# Patient Record
Sex: Female | Born: 1970
Health system: Southern US, Community
[De-identification: ages and names within clinical notes are randomized; demographics above are authoritative.]

## PROBLEM LIST (undated history)

## (undated) DIAGNOSIS — G459 Transient cerebral ischemic attack, unspecified: Secondary | ICD-10-CM

## (undated) DIAGNOSIS — I639 Cerebral infarction, unspecified: Secondary | ICD-10-CM

## (undated) DIAGNOSIS — E7800 Pure hypercholesterolemia, unspecified: Secondary | ICD-10-CM

## (undated) DIAGNOSIS — R569 Unspecified convulsions: Secondary | ICD-10-CM

## (undated) DIAGNOSIS — C801 Malignant (primary) neoplasm, unspecified: Secondary | ICD-10-CM

## (undated) DIAGNOSIS — D649 Anemia, unspecified: Secondary | ICD-10-CM

## (undated) DIAGNOSIS — I10 Essential (primary) hypertension: Secondary | ICD-10-CM

## (undated) DIAGNOSIS — E119 Type 2 diabetes mellitus without complications: Secondary | ICD-10-CM

## (undated) HISTORY — DX: Cerebral infarction, unspecified: I63.9

## (undated) HISTORY — PX: COLONOSCOPY: SHX174

## (undated) HISTORY — PX: BREAST BIOPSY: SHX20

## (undated) HISTORY — PX: NO PAST SURGERIES: SHX2092

---

## 1998-04-14 ENCOUNTER — Emergency Department (HOSPITAL_COMMUNITY): Admission: EM | Admit: 1998-04-14 | Discharge: 1998-04-14 | Payer: Self-pay | Admitting: Emergency Medicine

## 2017-01-14 ENCOUNTER — Inpatient Hospital Stay (HOSPITAL_COMMUNITY)
Admission: EM | Admit: 2017-01-14 | Discharge: 2017-01-16 | DRG: 100 | Disposition: A | Discharge status: Home / Self Care | Payer: 59 | Attending: Family Medicine | Admitting: Family Medicine

## 2017-01-14 ENCOUNTER — Emergency Department (HOSPITAL_COMMUNITY): Payer: 59

## 2017-01-14 ENCOUNTER — Encounter (HOSPITAL_COMMUNITY): Payer: Self-pay

## 2017-01-14 DIAGNOSIS — I6783 Posterior reversible encephalopathy syndrome: Secondary | ICD-10-CM

## 2017-01-14 DIAGNOSIS — R569 Unspecified convulsions: Secondary | ICD-10-CM

## 2017-01-14 DIAGNOSIS — I6340 Cerebral infarction due to embolism of unspecified cerebral artery: Secondary | ICD-10-CM | POA: Diagnosis present

## 2017-01-14 DIAGNOSIS — G4089 Other seizures: Principal | ICD-10-CM | POA: Diagnosis present

## 2017-01-14 DIAGNOSIS — E119 Type 2 diabetes mellitus without complications: Secondary | ICD-10-CM

## 2017-01-14 DIAGNOSIS — I5189 Other ill-defined heart diseases: Secondary | ICD-10-CM

## 2017-01-14 DIAGNOSIS — I639 Cerebral infarction, unspecified: Secondary | ICD-10-CM

## 2017-01-14 DIAGNOSIS — I160 Hypertensive urgency: Secondary | ICD-10-CM | POA: Diagnosis present

## 2017-01-14 DIAGNOSIS — I161 Hypertensive emergency: Secondary | ICD-10-CM | POA: Diagnosis present

## 2017-01-14 DIAGNOSIS — R202 Paresthesia of skin: Secondary | ICD-10-CM | POA: Diagnosis present

## 2017-01-14 DIAGNOSIS — E669 Obesity, unspecified: Secondary | ICD-10-CM | POA: Diagnosis present

## 2017-01-14 DIAGNOSIS — E785 Hyperlipidemia, unspecified: Secondary | ICD-10-CM | POA: Diagnosis present

## 2017-01-14 DIAGNOSIS — Z6838 Body mass index (BMI) 38.0-38.9, adult: Secondary | ICD-10-CM

## 2017-01-14 DIAGNOSIS — I5030 Unspecified diastolic (congestive) heart failure: Secondary | ICD-10-CM | POA: Diagnosis present

## 2017-01-14 DIAGNOSIS — G4733 Obstructive sleep apnea (adult) (pediatric): Secondary | ICD-10-CM | POA: Diagnosis present

## 2017-01-14 DIAGNOSIS — I110 Hypertensive heart disease with heart failure: Secondary | ICD-10-CM | POA: Diagnosis present

## 2017-01-14 DIAGNOSIS — R29700 NIHSS score 0: Secondary | ICD-10-CM | POA: Diagnosis present

## 2017-01-14 DIAGNOSIS — D649 Anemia, unspecified: Secondary | ICD-10-CM | POA: Diagnosis present

## 2017-01-14 DIAGNOSIS — I10 Essential (primary) hypertension: Secondary | ICD-10-CM

## 2017-01-14 DIAGNOSIS — E1165 Type 2 diabetes mellitus with hyperglycemia: Secondary | ICD-10-CM | POA: Diagnosis present

## 2017-01-14 LAB — I-STAT CHEM 8, ED
BUN: 10 mg/dL (ref 6–20)
CALCIUM ION: 1.2 mmol/L (ref 1.15–1.40)
CHLORIDE: 103 mmol/L (ref 101–111)
CREATININE: 0.7 mg/dL (ref 0.44–1.00)
GLUCOSE: 416 mg/dL — AB (ref 65–99)
HCT: 33 % — ABNORMAL LOW (ref 36.0–46.0)
Hemoglobin: 11.2 g/dL — ABNORMAL LOW (ref 12.0–15.0)
Potassium: 4.2 mmol/L (ref 3.5–5.1)
Sodium: 135 mmol/L (ref 135–145)
TCO2: 22 mmol/L (ref 22–32)

## 2017-01-14 LAB — DIFFERENTIAL
BASOS PCT: 0 %
Basophils Absolute: 0 10*3/uL (ref 0.0–0.1)
EOS PCT: 1 %
Eosinophils Absolute: 0.1 10*3/uL (ref 0.0–0.7)
LYMPHS ABS: 2.4 10*3/uL (ref 0.7–4.0)
LYMPHS PCT: 26 %
Monocytes Absolute: 0.6 10*3/uL (ref 0.1–1.0)
Monocytes Relative: 6 %
Neutro Abs: 6.1 10*3/uL (ref 1.7–7.7)
Neutrophils Relative %: 67 %

## 2017-01-14 LAB — COMPREHENSIVE METABOLIC PANEL
ALBUMIN: 3.9 g/dL (ref 3.5–5.0)
ALK PHOS: 84 U/L (ref 38–126)
ALT: 12 U/L — AB (ref 14–54)
ANION GAP: 6 (ref 5–15)
AST: 17 U/L (ref 15–41)
BILIRUBIN TOTAL: 0.4 mg/dL (ref 0.3–1.2)
BUN: 10 mg/dL (ref 6–20)
CALCIUM: 9.8 mg/dL (ref 8.9–10.3)
CO2: 20 mmol/L — ABNORMAL LOW (ref 22–32)
CREATININE: 0.88 mg/dL (ref 0.44–1.00)
Chloride: 102 mmol/L (ref 101–111)
GFR calc Af Amer: >60 mL/min (ref >60)
GFR calc non Af Amer: >60 mL/min (ref >60)
GLUCOSE: 423 mg/dL — AB (ref 65–99)
Potassium: 4.2 mmol/L (ref 3.5–5.1)
Sodium: 128 mmol/L — ABNORMAL LOW (ref 135–145)
TOTAL PROTEIN: 7.2 g/dL (ref 6.5–8.1)

## 2017-01-14 LAB — CBC
HEMATOCRIT: 30.6 % — AB (ref 36.0–46.0)
Hemoglobin: 8.7 g/dL — ABNORMAL LOW (ref 12.0–15.0)
MCH: 19.9 pg — AB (ref 26.0–34.0)
MCHC: 28.4 g/dL — ABNORMAL LOW (ref 30.0–36.0)
MCV: 69.9 fL — ABNORMAL LOW (ref 78.0–100.0)
PLATELETS: 519 10*3/uL — AB (ref 150–400)
RBC: 4.38 MIL/uL (ref 3.87–5.11)
RDW: 16 % — AB (ref 11.5–15.5)
WBC: 9.2 10*3/uL (ref 4.0–10.5)

## 2017-01-14 LAB — PROTIME-INR
INR: 1.03
PROTHROMBIN TIME: 13.4 s (ref 11.4–15.2)

## 2017-01-14 LAB — APTT: aPTT: 29 seconds (ref 24–36)

## 2017-01-14 LAB — I-STAT TROPONIN, ED: Troponin i, poc: 0 ng/mL (ref 0.00–0.08)

## 2017-01-14 LAB — CBG MONITORING, ED: GLUCOSE-CAPILLARY: 394 mg/dL — AB (ref 65–99)

## 2017-01-14 MED ORDER — LABETALOL HCL 5 MG/ML IV SOLN
5.0000 mg | Freq: Once | INTRAVENOUS | Status: AC
Start: 2017-01-15 — End: 2017-01-14
  Administered 2017-01-14: 5 mg via INTRAVENOUS
  Filled 2017-01-14: qty 4

## 2017-01-14 MED ORDER — SODIUM CHLORIDE 0.9 % IV SOLN
1000.0000 mg | Freq: Once | INTRAVENOUS | Status: AC
  Administered 2017-01-14: 1000 mg via INTRAVENOUS
  Filled 2017-01-14: qty 10

## 2017-01-14 NOTE — ED Provider Notes (Signed)
[facility] DEPT Provider Note   CSN: [id number] Arrival date & time: 01/14/17  2309     History   Chief Complaint Chief Complaint  Patient presents with  . Code Stroke    HPI Kelly Davies is a 46 y.o. female.  The history is provided by the patient and medical records.    46 y.o. F with hx of seasonal allergies, presenting to the ED via EMS as a code stroke.  Patient reports she woke up from a nap around 2230 and noticed that her left arm was twitching and felt "odd".  States she also felt a little foggy headed.  EMS was called and felt that she had some left sided weakness so code stroke was activated.  On arrival to ED she is AAOx3.  No focal deficits noted here.  No pain, SOB, or recent illness.  No history of seizures or HTN.  No family hx of seizure.  Reports brother recently had MI, he is 75.  No other known cardiac hx.  She is not a smoker.  Patient's BP has improved here but remains hypertensive.  No PCP.  History reviewed. No pertinent past medical history.  There are no active problems to display for this patient.   History reviewed. No pertinent surgical history.  OB History    No data available       Home Medications    Prior to Admission medications   Not on File    Family History No family history on file.  Social History Social History  Substance Use Topics  . Smoking status: Never Smoker  . Smokeless tobacco: Never Used  . Alcohol use No     Allergies   Patient has no allergy information on record.   Review of Systems Review of Systems  Neurological: Positive for weakness.  All other systems reviewed and are negative.    Physical Exam Updated Vital Signs Wt 99.7 kg (219 lb 12.8 oz)   Physical Exam  Constitutional: She is oriented to person, place, and time. She appears well-developed and well-nourished.  HENT:  Head: Normocephalic and atraumatic.  Mouth/Throat: Oropharynx is clear and moist.  Eyes: Pupils are equal,  round, and reactive to light. Conjunctivae and EOM are normal.  Neck: Normal range of motion.  Cardiovascular: Regular rhythm and normal heart sounds.  Tachycardia present.   Pulmonary/Chest: Effort normal and breath sounds normal. No respiratory distress. She has no wheezes.  Abdominal: Soft. Bowel sounds are normal. There is no tenderness. There is no rebound.  Musculoskeletal: Normal range of motion.  Neurological: She is alert and oriented to person, place, and time.  AAOx3, answering questions and following commands appropriately; equal strength UE and LE bilaterally; CN grossly intact; moves all extremities appropriately without ataxia; no focal neuro deficits or facial asymmetry appreciated  Skin: Skin is warm and dry.  Psychiatric: She has a normal mood and affect.  Nursing note and vitals reviewed.    ED Treatments / Results  Labs (all labs ordered are listed, but only abnormal results are displayed) Labs Reviewed  CBC - Abnormal; Notable for the following:       Result Value   Hemoglobin 8.7 (*)    HCT 30.6 (*)    MCV 69.9 (*)    MCH 19.9 (*)    MCHC 28.4 (*)    RDW 16.0 (*)    Platelets 519 (*)    All other components within normal limits  COMPREHENSIVE METABOLIC PANEL - Abnormal; Notable for  the following:    Sodium 128 (*)    CO2 20 (*)    Glucose, Bld 423 (*)    ALT 12 (*)    All other components within normal limits  CBG MONITORING, ED - Abnormal; Notable for the following:    Glucose-Capillary 394 (*)    All other components within normal limits  I-STAT CHEM 8, ED - Abnormal; Notable for the following:    Glucose, Bld 416 (*)    Hemoglobin 11.2 (*)    HCT 33.0 (*)    All other components within normal limits  PROTIME-INR  APTT  DIFFERENTIAL  HIV ANTIBODY (ROUTINE TESTING)  HEMOGLOBIN A1C  CBC  BASIC METABOLIC PANEL  I-STAT TROPONIN, ED    EKG  EKG Interpretation None       Radiology Ct Head Code Stroke Wo Contrast  Result Date:  01/14/2017 CLINICAL DATA:  Code stroke.  LEFT-sided weakness and numbness. EXAM: CT HEAD WITHOUT CONTRAST TECHNIQUE: Contiguous axial images were obtained from the base of the skull through the vertex without intravenous contrast. COMPARISON:  None. FINDINGS: BRAIN: No intraparenchymal hemorrhage, mass effect nor midline shift. The ventricles and sulci are normal. No acute large vascular territory infarcts. No abnormal extra-axial fluid collections. Beam hardening artifact through ventral pons. Basal cisterns are patent. VASCULAR: Unremarkable. SKULL/SOFT TISSUES: No skull fracture. No significant soft tissue swelling. ORBITS/SINUSES: The included ocular globes and orbital contents are normal.The mastoid aircells and included paranasal sinuses are well-aerated. OTHER: None. ASPECTS [facility] Stroke Program Early CT Score) - Ganglionic level infarction (caudate, lentiform nuclei, internal capsule, insula, M1-M3 cortex): 7 - Supraganglionic infarction (M4-M6 cortex): 3 Total score (0-10 with 10 being normal): 10 IMPRESSION: 1. Normal noncontrast CT HEAD. 2. ASPECTS is 10. 3. Critical Value/emergent results were called by telephone at the time of interpretation on 01/14/2017 at 11:24 pm to Dr. Leonel Ramsay, Neurology, who verbally acknowledged these results. Electronically Signed   By: Elon Alas M.D.   On: 01/14/2017 23:24    Procedures Procedures (including critical care time)  CRITICAL CARE Performed by: Larene Pickett   Total critical care time: 40 minutes  Critical care time was exclusive of separately billable procedures and treating other patients.  Critical care was necessary to treat or prevent imminent or life-threatening deterioration.  Critical care was time spent personally by me on the following activities: development of treatment plan with patient and/or surrogate as well as nursing, discussions with consultants, evaluation of patient's response to treatment, examination of patient,  obtaining history from patient or surrogate, ordering and performing treatments and interventions, ordering and review of laboratory studies, ordering and review of radiographic studies, pulse oximetry and re-evaluation of patient's condition.   Medications Ordered in ED Medications  levETIRAcetam (KEPPRA) 500 mg in sodium chloride 0.9 % 100 mL IVPB (not administered)  amLODipine (NORVASC) tablet 5 mg (not administered)  acetaminophen (TYLENOL) tablet 650 mg (not administered)    Or  acetaminophen (TYLENOL) suppository 650 mg (not administered)  ondansetron (ZOFRAN) tablet 4 mg (not administered)    Or  ondansetron (ZOFRAN) injection 4 mg (not administered)  enoxaparin (LOVENOX) injection 40 mg (not administered)  insulin aspart (novoLOG) injection 0-15 Units (not administered)  insulin glargine (LANTUS) injection 10 Units (not administered)  levETIRAcetam (KEPPRA) 1,000 mg in sodium chloride 0.9 % 100 mL IVPB (0 mg Intravenous Stopped 01/15/17 0017)  labetalol (NORMODYNE,TRANDATE) injection 5 mg (5 mg Intravenous Given 01/14/17 2349)     Initial Impression / Assessment and Plan / ED  Course  I have reviewed the triage vital signs and the nursing notes.  Pertinent labs & imaging results that were available during my care of the patient were reviewed by me and considered in my medical decision making (see chart for details).  46 y.o. F presenting to the ED as a code stroke.  LKW 2230 after she woke from a nap.  EMS felt left sided weakness so code stroke activated.  No focal deficits noted here.  States she feels better currently. Patient has no known past medical history, however was very hypertensive on arrival to 553'Z systolic.  Glucose also elevated into the 400's.  Initial head CT negative.  Neurology, Dr. Leonel Ramsay has evaluated-- feels this is likely PRES.  Will treat at hypertensive emergency, start keppra- load 1g now, then 500mg  BID.  They will folllow along.  BP's trending down  after dose of labetolol here.  Spoke with Dr. Alcario Drought-- he will admit for ongoing care.  Final Clinical Impressions(s) / ED Diagnoses   Final diagnoses:  PRES (posterior reversible encephalopathy syndrome)  Seizure ([place])  Hypertension, unspecified type    New Prescriptions New Prescriptions   No medications on file     Larene Pickett, PA-C 01/15/17 0211    Merryl Hacker, MD 01/16/17 1128

## 2017-01-14 NOTE — ED Triage Notes (Signed)
Pt from home with ems, code stroke LSN 2230 today. Pt woke up from a nap with her left arm twitching, pt also felt "foggy". Upon EMS arrival pt had decreased grip strength in left hand, tingling has improved. Pt hypertensive 240/120 intially, pt now 188/98. Pt a.o, nad, 20GLhand

## 2017-01-14 NOTE — Progress Notes (Signed)
Kelly Davies presents to ED via [facility] EMS with acute onset of Left arm twitching and feeling "foggy." Per EMS pt grip strength was weak to Left hand, Left leg was "wobbling" and had a approx 10 sec Right side gaze. Pt was hypertensive with BP 240/120 on EMS arrival. Only medical history is seasonal allergies. Code stroke canceled at 2321. Pt to be admitted to [facility] for further evaluation. LKW 2225, CBG 394, NIH 0

## 2017-01-15 ENCOUNTER — Observation Stay (HOSPITAL_COMMUNITY): Payer: 59

## 2017-01-15 ENCOUNTER — Inpatient Hospital Stay (HOSPITAL_COMMUNITY): Payer: 59

## 2017-01-15 DIAGNOSIS — I6783 Posterior reversible encephalopathy syndrome: Secondary | ICD-10-CM | POA: Diagnosis not present

## 2017-01-15 DIAGNOSIS — I10 Essential (primary) hypertension: Secondary | ICD-10-CM | POA: Diagnosis not present

## 2017-01-15 DIAGNOSIS — E119 Type 2 diabetes mellitus without complications: Secondary | ICD-10-CM

## 2017-01-15 DIAGNOSIS — I639 Cerebral infarction, unspecified: Secondary | ICD-10-CM | POA: Diagnosis not present

## 2017-01-15 DIAGNOSIS — I5189 Other ill-defined heart diseases: Secondary | ICD-10-CM | POA: Diagnosis not present

## 2017-01-15 DIAGNOSIS — R569 Unspecified convulsions: Secondary | ICD-10-CM | POA: Diagnosis present

## 2017-01-15 DIAGNOSIS — G4733 Obstructive sleep apnea (adult) (pediatric): Secondary | ICD-10-CM | POA: Diagnosis present

## 2017-01-15 DIAGNOSIS — G4089 Other seizures: Secondary | ICD-10-CM | POA: Diagnosis present

## 2017-01-15 DIAGNOSIS — E785 Hyperlipidemia, unspecified: Secondary | ICD-10-CM | POA: Diagnosis present

## 2017-01-15 DIAGNOSIS — I5030 Unspecified diastolic (congestive) heart failure: Secondary | ICD-10-CM | POA: Diagnosis present

## 2017-01-15 DIAGNOSIS — I160 Hypertensive urgency: Secondary | ICD-10-CM | POA: Diagnosis not present

## 2017-01-15 DIAGNOSIS — D649 Anemia, unspecified: Secondary | ICD-10-CM | POA: Diagnosis present

## 2017-01-15 DIAGNOSIS — I161 Hypertensive emergency: Secondary | ICD-10-CM | POA: Diagnosis present

## 2017-01-15 DIAGNOSIS — R202 Paresthesia of skin: Secondary | ICD-10-CM | POA: Diagnosis present

## 2017-01-15 DIAGNOSIS — R29700 NIHSS score 0: Secondary | ICD-10-CM | POA: Diagnosis present

## 2017-01-15 DIAGNOSIS — I6340 Cerebral infarction due to embolism of unspecified cerebral artery: Secondary | ICD-10-CM | POA: Diagnosis present

## 2017-01-15 DIAGNOSIS — Z6838 Body mass index (BMI) 38.0-38.9, adult: Secondary | ICD-10-CM | POA: Diagnosis not present

## 2017-01-15 DIAGNOSIS — E1165 Type 2 diabetes mellitus with hyperglycemia: Secondary | ICD-10-CM | POA: Diagnosis present

## 2017-01-15 DIAGNOSIS — E669 Obesity, unspecified: Secondary | ICD-10-CM | POA: Diagnosis present

## 2017-01-15 DIAGNOSIS — I110 Hypertensive heart disease with heart failure: Secondary | ICD-10-CM | POA: Diagnosis present

## 2017-01-15 LAB — BASIC METABOLIC PANEL
ANION GAP: 9 (ref 5–15)
BUN: 10 mg/dL (ref 6–20)
CO2: 22 mmol/L (ref 22–32)
Calcium: 10.1 mg/dL (ref 8.9–10.3)
Chloride: 103 mmol/L (ref 101–111)
Creatinine, Ser: 0.73 mg/dL (ref 0.44–1.00)
GLUCOSE: 336 mg/dL — AB (ref 65–99)
POTASSIUM: 4.1 mmol/L (ref 3.5–5.1)
Sodium: 134 mmol/L — ABNORMAL LOW (ref 135–145)

## 2017-01-15 LAB — GLUCOSE, CAPILLARY
GLUCOSE-CAPILLARY: 225 mg/dL — AB (ref 65–99)
GLUCOSE-CAPILLARY: 224 mg/dL — AB (ref 65–99)
Glucose-Capillary: 341 mg/dL — ABNORMAL HIGH (ref 65–99)
Glucose-Capillary: 220 mg/dL — ABNORMAL HIGH (ref 65–99)

## 2017-01-15 LAB — CBC
HEMATOCRIT: 28 % — AB (ref 36.0–46.0)
Hemoglobin: 8.4 g/dL — ABNORMAL LOW (ref 12.0–15.0)
MCH: 20.8 pg — ABNORMAL LOW (ref 26.0–34.0)
MCHC: 30 g/dL (ref 30.0–36.0)
MCV: 69.5 fL — ABNORMAL LOW (ref 78.0–100.0)
PLATELETS: 490 10*3/uL — AB (ref 150–400)
RBC: 4.03 MIL/uL (ref 3.87–5.11)
RDW: 16.5 % — ABNORMAL HIGH (ref 11.5–15.5)
WBC: 9.5 10*3/uL (ref 4.0–10.5)

## 2017-01-15 LAB — HIV ANTIBODY (ROUTINE TESTING W REFLEX): HIV SCREEN 4TH GENERATION: NONREACTIVE

## 2017-01-15 LAB — LIPID PANEL
CHOLESTEROL: 176 mg/dL (ref 0–200)
HDL: 38 mg/dL — AB (ref >40)
LDL CALC: 118 mg/dL — AB (ref 0–99)
TRIGLYCERIDES: 102 mg/dL (ref <150)
Total CHOL/HDL Ratio: 4.6 RATIO
VLDL: 20 mg/dL (ref 0–40)

## 2017-01-15 LAB — HEMOGLOBIN A1C
Hgb A1c MFr Bld: 10.5 % — ABNORMAL HIGH (ref 4.8–5.6)
MEAN PLASMA GLUCOSE: 254.65 mg/dL

## 2017-01-15 MED ORDER — STROKE: EARLY STAGES OF RECOVERY BOOK
Freq: Once | Status: AC
  Administered 2017-01-15: 19:00:00
  Filled 2017-01-15: qty 1

## 2017-01-15 MED ORDER — INSULIN GLARGINE 100 UNIT/ML ~~LOC~~ SOLN
10.0000 [IU] | Freq: Every day | SUBCUTANEOUS | Status: DC
  Administered 2017-01-15: 10 [IU] via SUBCUTANEOUS
  Filled 2017-01-15 (×2): qty 0.1

## 2017-01-15 MED ORDER — ENOXAPARIN SODIUM 40 MG/0.4ML ~~LOC~~ SOLN
40.0000 mg | SUBCUTANEOUS | Status: DC
  Administered 2017-01-15 – 2017-01-16 (×2): 40 mg via SUBCUTANEOUS
  Filled 2017-01-15 (×2): qty 0.4

## 2017-01-15 MED ORDER — INSULIN ASPART 100 UNIT/ML ~~LOC~~ SOLN
0.0000 [IU] | Freq: Three times a day (TID) | SUBCUTANEOUS | Status: DC
Start: 2017-01-15 — End: 2017-01-16
  Administered 2017-01-15 (×2): 5 [IU] via SUBCUTANEOUS
  Administered 2017-01-15: 11 [IU] via SUBCUTANEOUS
  Administered 2017-01-16: 3 [IU] via SUBCUTANEOUS
  Administered 2017-01-16: 8 [IU] via SUBCUTANEOUS
  Administered 2017-01-16: 5 [IU] via SUBCUTANEOUS

## 2017-01-15 MED ORDER — AMLODIPINE BESYLATE 5 MG PO TABS
5.0000 mg | ORAL_TABLET | Freq: Every day | ORAL | Status: DC
  Administered 2017-01-15 – 2017-01-16 (×2): 5 mg via ORAL
  Filled 2017-01-15 (×2): qty 1

## 2017-01-15 MED ORDER — ONDANSETRON HCL 4 MG/2ML IJ SOLN: 4.0000 mg | Freq: Four times a day (QID) | INTRAMUSCULAR | Status: DC | PRN

## 2017-01-15 MED ORDER — SODIUM CHLORIDE 0.9 % IV SOLN
500.0000 mg | Freq: Two times a day (BID) | INTRAVENOUS | Status: DC
  Administered 2017-01-16: 500 mg via INTRAVENOUS
  Filled 2017-01-15 (×2): qty 5

## 2017-01-15 MED ORDER — AMLODIPINE BESYLATE 5 MG PO TABS: 5.0000 mg | ORAL_TABLET | Freq: Every day | ORAL | Status: DC

## 2017-01-15 MED ORDER — ASPIRIN 325 MG PO TABS
325.0000 mg | ORAL_TABLET | Freq: Every day | ORAL | Status: DC
  Administered 2017-01-15 – 2017-01-16 (×2): 325 mg via ORAL
  Filled 2017-01-15 (×2): qty 1

## 2017-01-15 MED ORDER — ACETAMINOPHEN 325 MG PO TABS
650.0000 mg | ORAL_TABLET | Freq: Four times a day (QID) | ORAL | Status: DC | PRN
  Administered 2017-01-15: 650 mg via ORAL
  Filled 2017-01-15: qty 2

## 2017-01-15 MED ORDER — ONDANSETRON HCL 4 MG PO TABS: 4.0000 mg | ORAL_TABLET | Freq: Four times a day (QID) | ORAL | Status: DC | PRN

## 2017-01-15 MED ORDER — ACETAMINOPHEN 650 MG RE SUPP: 650.0000 mg | Freq: Four times a day (QID) | RECTAL | Status: DC | PRN

## 2017-01-15 MED ORDER — ATORVASTATIN CALCIUM 40 MG PO TABS
40.0000 mg | ORAL_TABLET | Freq: Every day | ORAL | Status: DC
  Administered 2017-01-15 – 2017-01-16 (×2): 40 mg via ORAL
  Filled 2017-01-15 (×2): qty 1

## 2017-01-15 MED ORDER — IOPAMIDOL (ISOVUE-370) INJECTION 76%
INTRAVENOUS | Status: AC
  Administered 2017-01-15: 50 mL
  Filled 2017-01-15: qty 50

## 2017-01-15 NOTE — Progress Notes (Signed)
Subjective: Fells the left UE is heavy  Exam: Vitals:   01/15/17 0505 01/15/17 1014  BP: (!) 160/81 (!) 153/87  Pulse: (!) 102   Resp: 14   Temp: 98.3 F (36.8 C)   SpO2: 100%     HEENT-  Normocephalic, no lesions, without obvious abnormality.  Normal external eye and conjunctiva.  Normal TM's bilaterally.  Normal auditory canals and external ears. Normal external nose, mucus membranes and septum.  Normal pharynx.    Neuro:  CN: Pupils are equal and round. They are symmetrically reactive from 3-->2 mm. EOMI without nystagmus. Facial sensation is intact to light touch. Face is symmetric at rest with normal strength and mobility. Hearing is intact to conversational voice. Palate elevates symmetrically and uvula is midline. Voice is normal in tone, pitch and quality. Bilateral SCM and trapezii are 5/5. Tongue is midline with normal bulk and mobility.  Motor: Normal bulk, tone, and strength. 5/5 throughout RU/RL extremitieswith 4+/5 LUE and 5/5 LLE. No drift.  Sensation: Intact to light touch.  DTRs: 2+, symmetric  Toes downgoing bilaterally. No pathologic reflexes.  Coordination: Finger-to-nose and heel-to-shin are without dysmetria     Pertinent Labs/Diagnostics: MRI brain--Multiple tiny likely acute RIGHT frontoparietal (ACA and MCA [place]). A1c 10.5--goal is less than 5.7    Impression: Multiple tiny likely acute RIGHT frontoparietal (ACA and MCA territory).   Recommendations: 1) CTA head and neck 2) ASA daily 3) goal A1c 5.7 4) continue Blood pressure control 5) May D/C Keppra on discharge 6) EEG pending   Stroke team to follow in AM  Etta Quill PA-C [facility] [phone] 01/15/2017, 10:52 AM

## 2017-01-15 NOTE — Progress Notes (Signed)
EEG completed; results pending.    

## 2017-01-15 NOTE — Progress Notes (Signed)
Inpatient Diabetes Program Recommendations  AACE/ADA: New Consensus Statement on Inpatient Glycemic Control (2015)  Target Ranges:  Prepandial:   less than 140 mg/dL      Peak postprandial:   less than 180 mg/dL (1-2 hours)      Critically ill patients:  140 - 180 mg/dL   Lab Results  Component Value Date   GLUCAP 225 (H) 01/15/2017   HGBA1C 10.5 (H) 01/15/2017    Review of Glycemic Control  Diabetes history: New-onset Outpatient Diabetes medications: None Current orders for Inpatient glycemic control: Lantus 10 units QHS, Novolog 0-15 units tidwc  HgbA1C - 10.5%  Inpatient Diabetes Program Recommendations:    Add HS correction Will order Living Well book and diabetes videos on pt ed channel. OP Diabetes Education after discharge.  Will speak with pt in am regarding new diagnosis of DM.  Thank you. Lorenda Peck, RD, LDN, CDE Inpatient Diabetes Coordinator [phone]

## 2017-01-15 NOTE — ED Notes (Signed)
Patient transported to MRI 

## 2017-01-15 NOTE — H&P (Signed)
History and Physical    Kelly Davies DOB: June 08, 1970 DOA: 01/14/2017  PCP: Patient, No Pcp Per  Patient coming from: Home  I have personally briefly reviewed patient's old medical records in [place]  Chief Complaint: Seizure  HPI: Kelly Davies is a 46 y.o. female with medical history significant of doesn't see a doctor.  Patient presents to the ED after waking up earlier this evening with onset of tremor to LUE and LLE.  Had weakness after event and was "foggy".  20-30 min L sided weakness after event.  Event lasted 1 min.   ED Course: CT head neg.  Patient with initial BP 240/120 per EMS, treated as PRES in ED and patient given 5mg  labetalol.  BP improved to 093G systolic at this time.  Also she has undiagnosed DM with BGL 423.   Review of Systems: As per HPI otherwise 10 point review of systems negative.   History reviewed. No pertinent past medical history.  History reviewed. No pertinent surgical history.   reports that she has never smoked. She has never used smokeless tobacco. She reports that she does not drink alcohol or use drugs.  No Known Allergies  No family history on file. No family history of seizures  Prior to Admission medications   Not on File    Physical Exam: Vitals:   01/15/17 0046 01/15/17 0100 01/15/17 0115 01/15/17 0145  BP:  (!) 162/87 (!) 154/84 (!) 148/79  Pulse:  (!) 105  (!) 104  Resp:  18 (!) 22 18  Temp: 98.8 F (37.1 C)     TempSrc:      SpO2:  99%  99%  Weight:        Constitutional: NAD, calm, comfortable Eyes: PERRL, lids and conjunctivae normal ENMT: Mucous membranes are moist. Posterior pharynx clear of any exudate or lesions.Normal dentition.  Neck: normal, supple, no masses, no thyromegaly Respiratory: clear to auscultation bilaterally, no wheezing, no crackles. Normal respiratory effort. No accessory muscle use.  Cardiovascular: Regular rate and rhythm, no murmurs / rubs / gallops. No  extremity edema. 2+ pedal pulses. No carotid bruits.  Abdomen: no tenderness, no masses palpated. No hepatosplenomegaly. Bowel sounds positive.  Musculoskeletal: no clubbing / cyanosis. No joint deformity upper and lower extremities. Good ROM, no contractures. Normal muscle tone.  Skin: no rashes, lesions, ulcers. No induration Neurologic: CN 2-12 grossly intact. Sensation intact, DTR normal. Strength 5/5 in all 4.  Psychiatric: Normal judgment and insight. Alert and oriented x 3. Normal mood.    Labs on Admission: I have personally reviewed following labs and imaging studies  CBC:  Recent Labs Lab 01/14/17 2310 01/14/17 2319  WBC 9.2  --   NEUTROABS 6.1  --   HGB 8.7* 11.2*  HCT 30.6* 33.0*  MCV 69.9*  --   PLT 519*  --    Basic Metabolic Panel:  Recent Labs Lab 01/14/17 2310 01/14/17 2319  NA 128* 135  K 4.2 4.2  CL 102 103  CO2 20*  --   GLUCOSE 423* 416*  BUN 10 10  CREATININE 0.88 0.70  CALCIUM 9.8  --    GFR: CrCl cannot be calculated (Unknown ideal weight.). Liver Function Tests:  Recent Labs Lab 01/14/17 2310  AST 17  ALT 12*  ALKPHOS 84  BILITOT 0.4  PROT 7.2  ALBUMIN 3.9   No results for input(s): LIPASE, AMYLASE in the last 168 hours. No results for input(s): AMMONIA in the last 168 hours.  Coagulation Profile:  Recent Labs Lab 01/14/17 2310  INR 1.03   Cardiac Enzymes: No results for input(s): CKTOTAL, CKMB, CKMBINDEX, TROPONINI in the last 168 hours. BNP (last 3 results) No results for input(s): PROBNP in the last 8760 hours. HbA1C: No results for input(s): HGBA1C in the last 72 hours. CBG:  Recent Labs Lab 01/14/17 2312  GLUCAP 394*   Lipid Profile: No results for input(s): CHOL, HDL, LDLCALC, TRIG, CHOLHDL, LDLDIRECT in the last 72 hours. Thyroid Function Tests: No results for input(s): TSH, T4TOTAL, FREET4, T3FREE, THYROIDAB in the last 72 hours. Anemia Panel: No results for input(s): VITAMINB12, FOLATE, FERRITIN, TIBC,  IRON, RETICCTPCT in the last 72 hours. Urine analysis: No results found for: COLORURINE, APPEARANCEUR, LABSPEC, Delta, GLUCOSEU, HGBUR, BILIRUBINUR, KETONESUR, PROTEINUR, UROBILINOGEN, NITRITE, LEUKOCYTESUR  Radiological Exams on Admission: Ct Head Code Stroke Wo Contrast  Result Date: 01/14/2017 CLINICAL DATA:  Code stroke.  LEFT-sided weakness and numbness. EXAM: CT HEAD WITHOUT CONTRAST TECHNIQUE: Contiguous axial images were obtained from the base of the skull through the vertex without intravenous contrast. COMPARISON:  None. FINDINGS: BRAIN: No intraparenchymal hemorrhage, mass effect nor midline shift. The ventricles and sulci are normal. No acute large vascular territory infarcts. No abnormal extra-axial fluid collections. Beam hardening artifact through ventral pons. Basal cisterns are patent. VASCULAR: Unremarkable. SKULL/SOFT TISSUES: No skull fracture. No significant soft tissue swelling. ORBITS/SINUSES: The included ocular globes and orbital contents are normal.The mastoid aircells and included paranasal sinuses are well-aerated. OTHER: None. ASPECTS [facility] Stroke Program Early CT Score) - Ganglionic level infarction (caudate, lentiform nuclei, internal capsule, insula, M1-M3 cortex): 7 - Supraganglionic infarction (M4-M6 cortex): 3 Total score (0-10 with 10 being normal): 10 IMPRESSION: 1. Normal noncontrast CT HEAD. 2. ASPECTS is 10. 3. Critical Value/emergent results were called by telephone at the time of interpretation on 01/14/2017 at 11:24 pm to Dr. Leonel Ramsay, Neurology, who verbally acknowledged these results. Electronically Signed   By: Elon Alas M.D.   On: 01/14/2017 23:24    EKG: Independently reviewed.  Assessment/Plan Principal Problem:   Seizure [facility]) Active Problems:   New onset type 2 diabetes mellitus ([place])   Hypertensive urgency    1. Seizure - likely hypertensive urgency 1. Keppra 1gm load and 500 BID per neurology 2. MRI brain 3. EEG 2. HTN  urgency - 1. Resolved with labetalol 2. Start Norvasc 5mg  PO daily this morning, titrate up to 10mg  if needed 3. New onset DM2 - 1. Will put patient on Lantus 10 units QHS 2. And mod scale SSI AC while inpatient 3. Suspect she ultimately will be a candidate for PO hypoglycemics as outpatient (metformin) 4. DM nurse educator consult 4. ? Anemia - 1. HGB 8.7 on initial CBC then 11.2 on I-stat 2. Will repeat CBC in AM, if anemia confirmed, then needs anemia work up.  DVT prophylaxis: Lovenox Code Status: Full Family Communication: Family at bedside Disposition Plan: Home after admit Consults called: Dr. Leonel Ramsay has seen patient Admission status: Place in obs   Deneen Slager, [place] [facility] Pager [phone]  If 7AM-7PM, please contact day team taking care of patient [email or web address] Password [email or web address]  01/15/2017, 1:53 AM

## 2017-01-15 NOTE — Procedures (Signed)
ELECTROENCEPHALOGRAM REPORT  Date of Study: 01/15/2017  Patient's Name: Kelly Davies MRN: [id number] Date of Birth: June 09, 1970  Referring Provider: Dr. Roland Rack  Clinical History: This is a 46 year old woman with left sided tremor followed by weakness and confusion.  Medications: levETIRAcetam (KEPPRA) 500 mg in sodium chloride 0.9 % 100 mL IVPB  acetaminophen (TYLENOL) tablet 650 mg  amLODipine (NORVASC) tablet 5 mg  aspirin tablet 325 mg  atorvastatin (LIPITOR) tablet 40 mg  enoxaparin (LOVENOX) injection 40 mg  insulin aspart (novoLOG) injection 0-15 Units  insulin glargine (LANTUS) injection 10 Units   Technical Summary: A multichannel digital EEG recording measured by the international 10-20 system with electrodes applied with paste and impedances below 5000 ohms performed in our laboratory with EKG monitoring in an awake and asleep patient.  Hyperventilation and photic stimulation were not performed.  The digital EEG was referentially recorded, reformatted, and digitally filtered in a variety of bipolar and referential montages for optimal display.    Description: The patient is awake and asleep during the recording.  During maximal wakefulness, there is a symmetric, medium voltage 9-9.5 Hz posterior dominant rhythm that attenuates with eye opening.  The record is symmetric.  During drowsiness and sleep, there is an increase in theta slowing of the background, at times sharply contoured over the left temporal region without clear epileptogenic potential.  Vertex waves and symmetric sleep spindles were seen.  Hyperventilation and photic stimulation were not performed. There were no epileptiform discharges or electrographic seizures seen.    EKG lead was unremarkable.  Impression: This awake and asleep EEG is normal.    Clinical Correlation: A normal EEG does not exclude a clinical diagnosis of epilepsy. Clinical correlation is advised.   Ellouise Newer, M.D.

## 2017-01-15 NOTE — ED Notes (Signed)
Pt ambulatory to restroom independently.

## 2017-01-15 NOTE — ED Notes (Signed)
Okey Regal (brother) [phone]

## 2017-01-15 NOTE — Consult Note (Signed)
Neurology Consultation Reason for Consult: Seizure Referring Physician: Horton, C  CC: Seizure  History is obtained from: Patient  HPI: Kelly Davies is a 46 y.o. female who is in her normal state of health until around 10:30 when she began having numbness and tingling in her left hand. She states that started in her hand and moved upwards and then she began having jerking movements of her arm. She describes her entire arm jerking. She is not sure if her face or leg were normal. The jerking lasted for approximately 1 minute and for about 20-30 minutes following that she had left-sided weakness. Due to the left-sided weakness, EMS activated code stroke, but she was resolved by the time of arrival to the emergency department with the exception of very mild tingling in her fingertips on the left.   LKW: 10:30 PM tpa given?: no, resolution of symptoms    ROS: A 14 point ROS was performed and is negative except as noted in the HPI.   History reviewed. No pertinent past medical history.   Family history: No history of seizures   Social History:  reports that she has never smoked. She has never used smokeless tobacco. She reports that she does not drink alcohol or use drugs.   Exam: Current vital signs: BP (!) 169/89   Pulse (!) 103   Temp 99.1 F (37.3 C) (Oral)   Resp (!) 22   Wt 99.7 kg (219 lb 12.8 oz)   SpO2 99%  Vital signs in last 24 hours: Temp:  [99.1 F (37.3 C)] 99.1 F (37.3 C) (10/02 2328) Pulse Rate:  [103-116] 103 (10/03 0000) Resp:  [16-22] 22 (10/03 0000) BP: (164-175)/(89-95) 169/89 (10/03 0000) SpO2:  [99 %-100 %] 99 % (10/03 0000) Weight:  [99.7 kg (219 lb 12.8 oz)] 99.7 kg (219 lb 12.8 oz) (10/02 2325)   Physical Exam  Constitutional: Appears well-developed and well-nourished.  Psych: Affect appropriate to situation Eyes: No scleral injection HENT: No OP obstrucion Head: Normocephalic.  Cardiovascular: Normal rate and regular rhythm.   Respiratory: Effort normal and breath sounds normal to anterior ascultation GI: Soft.  No distension. There is no tenderness.  Skin: WDI  Neuro: Mental Status: Patient is awake, alert, oriented to person, place, month, year, and situation. Patient is able to give a clear and coherent history. No signs of aphasia or neglect Cranial Nerves: II: Visual Fields are full. Pupils are equal, round, and reactive to light.   III,IV, VI: EOMI without ptosis or diploplia.  V: Facial sensation is symmetric to temperature VII: Facial movement is symmetric.  VIII: hearing is intact to voice X: Uvula elevates symmetrically XI: Shoulder shrug is symmetric. XII: tongue is midline without atrophy or fasciculations.  Motor: Tone is normal. Bulk is normal. 5/5 strength was present in all four extremities.  Sensory: Sensation is symmetric to light touch and temperature in the arms and legs. Deep Tendon Reflexes: 2+ and symmetric in the biceps and patellae.  Cerebellar: FNF intact bilaterally  I have reviewed labs in epic and the results pertinent to this consultation are: CMP-glucose 423   I have reviewed the images obtained: CT head-negative for acute findings    Impression: 46 year old female with new onset seizure in the setting of severe hypertension and hyperglycemia. I suspect posterior reversible encephalopathy syndrome. I would treat her as hypertensive emergency, but we'll also start [place]. BGs in the 400s are usually not responsible for partial seizures.   Recommendations: 1) treatment of hypertensive urgency  2) Keppra 500 mg twice a day 3) EEG 4) MRI brain  Roland Rack, MD [facility] [phone]  If 7pm- 7am, please page neurology on call as listed in [place].

## 2017-01-15 NOTE — Progress Notes (Signed)
Patient seen and examined at bedside, patient admitted after midnight, please see earlier detailed admission note by Etta Quill, DO. Briefly, patient presented with seizure and found to have acute CVAs. Neurology obtaining CTA of head/neck. Stroke team to see on 10/2. Obtaining echocardiogram and starting ASA/Lipitor.   Cordelia Poche, MD [facility] 01/15/2017, 1:06 PM Pager: [phone]

## 2017-01-16 ENCOUNTER — Inpatient Hospital Stay (HOSPITAL_COMMUNITY): Payer: 59

## 2017-01-16 DIAGNOSIS — I639 Cerebral infarction, unspecified: Secondary | ICD-10-CM

## 2017-01-16 DIAGNOSIS — I10 Essential (primary) hypertension: Secondary | ICD-10-CM

## 2017-01-16 DIAGNOSIS — R569 Unspecified convulsions: Secondary | ICD-10-CM

## 2017-01-16 DIAGNOSIS — I5189 Other ill-defined heart diseases: Secondary | ICD-10-CM

## 2017-01-16 LAB — VAS US CAROTID
LEFT ECA DIAS: -10 cm/s
LEFT VERTEBRAL DIAS: 27 cm/s
LICADDIAS: -38 cm/s
LICADSYS: -95 cm/s
LICAPSYS: -89 cm/s
Left CCA dist dias: 26 cm/s
Left CCA dist sys: 81 cm/s
Left CCA prox dias: 19 cm/s
Left CCA prox sys: 147 cm/s
Left ICA prox dias: -29 cm/s
RIGHT ECA DIAS: -7 cm/s
RIGHT VERTEBRAL DIAS: 30 cm/s
Right CCA prox dias: 21 cm/s
Right CCA prox sys: 73 cm/s
Right cca dist sys: -76 cm/s

## 2017-01-16 LAB — GLUCOSE, CAPILLARY
GLUCOSE-CAPILLARY: 269 mg/dL — AB (ref 65–99)
GLUCOSE-CAPILLARY: 213 mg/dL — AB (ref 65–99)
Glucose-Capillary: 187 mg/dL — ABNORMAL HIGH (ref 65–99)

## 2017-01-16 LAB — LIPID PANEL
CHOL/HDL RATIO: 4.9 ratio
CHOLESTEROL: 168 mg/dL (ref 0–200)
HDL: 34 mg/dL — AB (ref >40)
LDL Cholesterol: 110 mg/dL — ABNORMAL HIGH (ref 0–99)
Triglycerides: 119 mg/dL (ref <150)
VLDL: 24 mg/dL (ref 0–40)

## 2017-01-16 LAB — ECHOCARDIOGRAM COMPLETE
HEIGHTINCHES: 62 in
Weight: 3387.2 oz

## 2017-01-16 MED ORDER — LEVETIRACETAM 500 MG PO TABS: 500.0000 mg | ORAL_TABLET | Freq: Two times a day (BID) | ORAL | Status: DC

## 2017-01-16 MED ORDER — BLOOD GLUCOSE METER KIT: PACK | 0 refills | Status: DC

## 2017-01-16 MED ORDER — LEVETIRACETAM 500 MG PO TABS: 500.0000 mg | ORAL_TABLET | Freq: Two times a day (BID) | ORAL | 0 refills | Status: DC

## 2017-01-16 MED ORDER — AMLODIPINE BESYLATE 5 MG PO TABS: 5.0000 mg | ORAL_TABLET | Freq: Every day | ORAL | 0 refills | Status: DC

## 2017-01-16 MED ORDER — ASPIRIN 325 MG PO TABS: 325.0000 mg | ORAL_TABLET | Freq: Every day | ORAL | 0 refills | Status: AC

## 2017-01-16 MED ORDER — ATORVASTATIN CALCIUM 40 MG PO TABS: 40.0000 mg | ORAL_TABLET | Freq: Every day | ORAL | 0 refills | Status: DC

## 2017-01-16 MED ORDER — INSULIN PEN NEEDLE 31G X 5 MM MISC: 0 refills | Status: DC

## 2017-01-16 MED ORDER — INSULIN GLARGINE 100 UNITS/ML SOLOSTAR PEN: 10.0000 [IU] | PEN_INJECTOR | Freq: Every day | SUBCUTANEOUS | 0 refills | Status: DC

## 2017-01-16 NOTE — Progress Notes (Signed)
Elisabeth Pigeon to be D/C'd to home per MD order.  Discussed with the patient and all questions fully answered.  VSS, Skin clean, dry and intact without evidence of skin break down, no evidence of skin tears noted. IV catheter discontinued intact. Site without signs and symptoms of complications. Dressing and pressure applied.  An After Visit Summary was printed and given to the patient. Patient received prescriptions.   D/c education completed with patient/family including follow up instructions, medication list, d/c activities limitations if indicated, with other d/c instructions as indicated by MD - patient able to verbalize understanding, all questions fully answered.   Case management to connect with patient tomorrow, 10/5 to make sure patient has all she needs for new diabetes diagnosis.  Patient states she feels comfortable with all new medications and diagnoses.     Patient instructed to return to ED, call 911, or call MD for any changes in condition.   Patient escorted via [place], and D/C home via private auto.  Morley Kos Price 01/16/2017 6:50 PM

## 2017-01-16 NOTE — Discharge Summary (Signed)
Physician Discharge Summary  Kelly Davies VPX:[id number] DOB: 07-20-1970 DOA: 01/14/2017  PCP: Patient, No Pcp Per  Admit date: 01/14/2017 Discharge date: 01/17/2017  Admitted From: Home Disposition: Home  Recommendations for Outpatient Follow-up:  1. Follow up with PCP in 1 week 2. Follow up with Dr. Leonie Man (neurology) in 6 weeks 3. Follow up with cardiology for transesophageal echocardiogram 4. Please obtain BMP/CBC in one week 5. Titrate insulin, hopefully can transition to oral agents at some point 6. Anemia workup 7. Please follow up on the following pending results: None  Home Health: None Equipment/Devices: None  Discharge Condition: Stable CODE STATUS: Full code Diet recommendation: Heart healthy/Carb modified   Brief/Interim Summary:  Admission HPI written by Etta Quill, DO   Chief Complaint: Seizure  HPI: Kelly Davies is a 46 y.o. female with medical history significant of doesn't see a doctor.  Patient presents to the ED after waking up earlier this evening with onset of tremor to LUE and LLE.  Had weakness after event and was "foggy".  20-30 min L sided weakness after event.  Event lasted 1 min.   ED Course: CT head neg.  Patient with initial BP 240/120 per EMS, treated as PRES in ED and patient given 42m labetalol.  BP improved to 1462Vsystolic at this time.  Also she has undiagnosed DM with BGL 423.   Hospital course:  Seizure No prior history. Started on Keppra. Although initial episode, neurology recommended to continue on discharge. EEG was obtained and was normal.  CVA Multiple tiny right frontoparietal infarcts seen on MRI from 01/15/17. Concern for embolic source. Echocardiogram without evidence of thrombus. TEE was attempted to be scheduled, however, it was not possible this week, so patient will be contacted for outpatient TEE by cardiology. Patient is aware and is expecting a call. LDL of 110. Hemoglobin A1c of 10.5%. She was started on  aspirin, atorvastatin and is being treated for new onset diabetes mellitus.  Hypertensive urgency Present on admission. Resolved with labetalol. Started on amlodipine.  Diabetes mellitus, type 2 Patient found to have an A1C of 10.5. She was started on Lantus 10 units daily and sliding scale insulin. She received education prior to discharge. She was discharged with a Lantus pen, glucometer, strips and lancets. She will need close follow-up for continued management. PCP appointment made with the help of the case manager.  Anemia Asymptomatic. Stable. Recommend outpatient workup.  Diastolic heart dysfunction Seen on echocardiogram.   Discharge Diagnoses:  Principal Problem:   Seizure ([facility] Active Problems:   New onset type 2 diabetes mellitus ([place]   Hypertensive urgency   Acute CVA (cerebrovascular accident) ([facility]   Diastolic dysfunction without heart failure   Hypertension    Discharge Instructions  Discharge Instructions    Call MD for:  difficulty breathing, headache or visual disturbances    Complete by:  As directed    Call MD for:  persistant dizziness or light-headedness    Complete by:  As directed    Diet - low sodium heart healthy    Complete by:  As directed    Increase activity slowly    Complete by:  As directed      Allergies as of 01/16/2017   No Known Allergies     Medication List    TAKE these medications   amLODipine 5 MG tablet Commonly known as:  NORVASC Take 1 tablet (5 mg total) by mouth daily.   aspirin 325 MG tablet Take 1 tablet (  325 mg total) by mouth daily.   atorvastatin 40 MG tablet Commonly known as:  LIPITOR Take 1 tablet (40 mg total) by mouth daily at 6 PM.   blood glucose meter kit and supplies Dispense based on patient and insurance preference. Use up to daily before breakfast. (FOR ICD-9 250.00, 250.01).   insulin glargine 100 unit/mL Sopn Commonly known as:  LANTUS Inject 0.1 mLs (10 Units total) into the skin at  bedtime.   Insulin Pen Needle 31G X 5 MM Misc BD Pen Needles- brand specific Inject insulin via insulin pen before breakfast   levETIRAcetam 500 MG tablet Commonly known as:  KEPPRA Take 1 tablet (500 mg total) by mouth 2 (two) times daily.      Follow-up Information    Maylon Peppers, MD. Schedule an appointment as soon as possible for a visit on 01/28/2017.   Specialty:  Family Medicine Contact information: [address] [place] [address] [phone]        Garvin Fila, MD. Schedule an appointment as soon as possible for a visit in 6 week(s).   Specialties:  Neurology, Radiology Why:  Seizure/Stroke follow-up Contact information: [address] [place] [address] [phone]          No Known Allergies  Consultations:  Neurology   Procedures/Studies: Ct Angio Head W Or Wo Contrast  Result Date: 01/15/2017 CLINICAL DATA:  Stroke follow-up EXAM: CT ANGIOGRAPHY HEAD AND NECK TECHNIQUE: Multidetector CT imaging of the head and neck was performed using the standard protocol during bolus administration of intravenous contrast. Multiplanar CT image reconstructions and MIPs were obtained to evaluate the vascular anatomy. Carotid stenosis measurements (when applicable) are obtained utilizing NASCET criteria, using the distal internal carotid diameter as the denominator. CONTRAST:  50 mL Isovue 370 COMPARISON:  Brain MRI 01/15/2017 FINDINGS: CT HEAD FINDINGS Brain: No mass lesion, intraparenchymal hemorrhage or extra-axial collection. No evidence of acute cortical infarct. Brain parenchyma and CSF-containing spaces are normal for age. Vascular: No hyperdense vessel or unexpected calcification. Skull: Normal visualized skull base, calvarium and extracranial soft tissues. Sinuses/Orbits: No sinus fluid levels or advanced mucosal thickening. No mastoid effusion. Normal orbits. CTA NECK FINDINGS Aortic arch: There is no aneurysm  or dissection of the visualized ascending aorta or aortic arch. Normal 3 vessel aortic branching pattern. The visualized proximal subclavian arteries are normal. Right carotid system: There is a focal enhancement abnormality within the proximal right common carotid artery. It has a discontinuous appearance and appears to be artifactual. This is at approximately the level of the infraglottic larynx. The remainder of the right common and internal carotid arteries is normal. Left carotid system: The left common carotid origin is widely patent. There is no common carotid or internal carotid artery dissection or aneurysm. No hemodynamically significant stenosis. Vertebral arteries: The vertebral system is codominant. Both vertebral artery origins are normal. Both vertebral arteries are normal to their confluence with the basilar artery. Skeleton: There is no bony spinal canal stenosis. No lytic or blastic lesions. Other neck: There is prominent pharyngeal soft tissue with likely positional narrowing of the airway, as may be seen in sleep apnea. Upper chest: No pneumothorax or pleural effusion. No nodules or masses. Review of the MIP images confirms the above findings CTA HEAD FINDINGS Anterior circulation: --Intracranial internal carotid arteries: Normal. --Anterior cerebral arteries: Normal. --Middle cerebral arteries: Normal. --Posterior communicating arteries: Absent bilaterally. Posterior circulation: --Posterior cerebral arteries: Normal. --Superior cerebellar arteries: Normal. --Basilar artery:  Normal. --Anterior inferior cerebellar arteries: Normal. --Posterior inferior cerebellar arteries: Normal. Venous sinuses: As permitted by contrast timing, patent. Anatomic variants: None Delayed phase: No parenchymal contrast enhancement. Review of the MIP images confirms the above findings IMPRESSION: 1. Focal enhancement abnormality of the proximal right common carotid artery, at approximately the level of the subglottic  larynx. Given the somewhat discontinuous appearance, I suspect that this is artifact, perhaps related to swallowing or laryngeal motion. However, in the context of multifocal acute ischemia within the right cerebral hemisphere, MRA of the neck with contrast is recommended for confirmation. 2. Otherwise normal examination of the arteries of the head and neck. 3. Prominent lower pharyngeal soft tissues with positional narrowing of the airway, as may be seen in the setting of sleep apnea. Electronically Signed   By: Ulyses Jarred M.D.   On: 01/15/2017 22:13   Ct Angio Neck W Or Wo Contrast  Result Date: 01/15/2017 CLINICAL DATA:  Stroke follow-up EXAM: CT ANGIOGRAPHY HEAD AND NECK TECHNIQUE: Multidetector CT imaging of the head and neck was performed using the standard protocol during bolus administration of intravenous contrast. Multiplanar CT image reconstructions and MIPs were obtained to evaluate the vascular anatomy. Carotid stenosis measurements (when applicable) are obtained utilizing NASCET criteria, using the distal internal carotid diameter as the denominator. CONTRAST:  50 mL Isovue 370 COMPARISON:  Brain MRI 01/15/2017 FINDINGS: CT HEAD FINDINGS Brain: No mass lesion, intraparenchymal hemorrhage or extra-axial collection. No evidence of acute cortical infarct. Brain parenchyma and CSF-containing spaces are normal for age. Vascular: No hyperdense vessel or unexpected calcification. Skull: Normal visualized skull base, calvarium and extracranial soft tissues. Sinuses/Orbits: No sinus fluid levels or advanced mucosal thickening. No mastoid effusion. Normal orbits. CTA NECK FINDINGS Aortic arch: There is no aneurysm or dissection of the visualized ascending aorta or aortic arch. Normal 3 vessel aortic branching pattern. The visualized proximal subclavian arteries are normal. Right carotid system: There is a focal enhancement abnormality within the proximal right common carotid artery. It has a discontinuous  appearance and appears to be artifactual. This is at approximately the level of the infraglottic larynx. The remainder of the right common and internal carotid arteries is normal. Left carotid system: The left common carotid origin is widely patent. There is no common carotid or internal carotid artery dissection or aneurysm. No hemodynamically significant stenosis. Vertebral arteries: The vertebral system is codominant. Both vertebral artery origins are normal. Both vertebral arteries are normal to their confluence with the basilar artery. Skeleton: There is no bony spinal canal stenosis. No lytic or blastic lesions. Other neck: There is prominent pharyngeal soft tissue with likely positional narrowing of the airway, as may be seen in sleep apnea. Upper chest: No pneumothorax or pleural effusion. No nodules or masses. Review of the MIP images confirms the above findings CTA HEAD FINDINGS Anterior circulation: --Intracranial internal carotid arteries: Normal. --Anterior cerebral arteries: Normal. --Middle cerebral arteries: Normal. --Posterior communicating arteries: Absent bilaterally. Posterior circulation: --Posterior cerebral arteries: Normal. --Superior cerebellar arteries: Normal. --Basilar artery: Normal. --Anterior inferior cerebellar arteries: Normal. --Posterior inferior cerebellar arteries: Normal. Venous sinuses: As permitted by contrast timing, patent. Anatomic variants: None Delayed phase: No parenchymal contrast enhancement. Review of the MIP images confirms the above findings IMPRESSION: 1. Focal enhancement abnormality of the proximal right common carotid artery, at approximately the level of the subglottic larynx. Given the somewhat discontinuous appearance, I suspect that this is artifact, perhaps related to swallowing or laryngeal motion. However, in the context of multifocal acute ischemia  within the right cerebral hemisphere, MRA of the neck with contrast is recommended for confirmation. 2.  Otherwise normal examination of the arteries of the head and neck. 3. Prominent lower pharyngeal soft tissues with positional narrowing of the airway, as may be seen in the setting of sleep apnea. Electronically Signed   By: Ulyses Jarred M.D.   On: 01/15/2017 22:13   Mr Brain Wo Contrast  Result Date: 01/15/2017 CLINICAL DATA:  1 minute episode of LEFT sided tremors this evening, felt unwell after. History of hypertension and diabetes. EXAM: MRI HEAD WITHOUT CONTRAST TECHNIQUE: Multiplanar, multiecho pulse sequences of the brain and surrounding structures were obtained without intravenous contrast. COMPARISON:  CT HEAD January 14, 2017 at 2322 hours FINDINGS: BRAIN: Scattered faint subcentimeter frontoparietal reduced diffusion see 6 identifiable foci on ADC map demonstrate low ADC values. The susceptibility artifact to suggest hemorrhage. Ventricles and sulci are normal for patient's age. No midline shift, mass effect or masses. No abnormal parenchymal signal. No abnormal extra-axial fluid collections. Symmetric normal size, morphology and signal of the hippocampi. VASCULAR: Normal major intracranial vascular flow voids present at skull base. SKULL AND UPPER CERVICAL SPINE: No abnormal sellar expansion. No suspicious calvarial bone marrow signal. Craniocervical junction maintained. SINUSES/ORBITS: Trace RIGHT maxillary sinus mucosal thickening without air-fluid levels. Mastoid air cells are well aerated. The all neck pain all wake The included ocular globes and orbital contents are non-suspicious. OTHER: None. IMPRESSION: 1. Multiple tiny likely acute RIGHT frontoparietal (ACA and MCA territory). 2. Otherwise normal noncontrast MRI of the head. Electronically Signed   By: Elon Alas M.D.   On: 01/15/2017 04:05   Ct Head Code Stroke Wo Contrast  Result Date: 01/14/2017 CLINICAL DATA:  Code stroke.  LEFT-sided weakness and numbness. EXAM: CT HEAD WITHOUT CONTRAST TECHNIQUE: Contiguous axial images were  obtained from the base of the skull through the vertex without intravenous contrast. COMPARISON:  None. FINDINGS: BRAIN: No intraparenchymal hemorrhage, mass effect nor midline shift. The ventricles and sulci are normal. No acute large vascular territory infarcts. No abnormal extra-axial fluid collections. Beam hardening artifact through ventral pons. Basal cisterns are patent. VASCULAR: Unremarkable. SKULL/SOFT TISSUES: No skull fracture. No significant soft tissue swelling. ORBITS/SINUSES: The included ocular globes and orbital contents are normal.The mastoid aircells and included paranasal sinuses are well-aerated. OTHER: None. ASPECTS [facility] Stroke Program Early CT Score) - Ganglionic level infarction (caudate, lentiform nuclei, internal capsule, insula, M1-M3 cortex): 7 - Supraganglionic infarction (M4-M6 cortex): 3 Total score (0-10 with 10 being normal): 10 IMPRESSION: 1. Normal noncontrast CT HEAD. 2. ASPECTS is 10. 3. Critical Value/emergent results were called by telephone at the time of interpretation on 01/14/2017 at 11:24 pm to Dr. Leonel Ramsay, Neurology, who verbally acknowledged these results. Electronically Signed   By: Elon Alas M.D.   On: 01/14/2017 23:24    Echocardiogram (01/16/17)  Study Conclusions  - Left ventricle: The cavity size was normal. Systolic function was   normal. The estimated ejection fraction was in the range of 60%   to 65%. Wall motion was normal; there were no regional wall   motion abnormalities. Doppler parameters are consistent with   abnormal left ventricular relaxation (grade 1 diastolic   dysfunction). - Mitral valve: There was mild regurgitation. - Left atrium: The atrium was mildly dilated.  Impressions:  - No cardiac source of emboli was indentified.  Vascular ultrasound of carotid arteries (01/16/17)  Preliminary result: 1-38% ICA stenosis bilaterally. Antegrade vertebral flow    Subjective: No chest pain. Mild  numbness of single  left middle digit. No weakness  Discharge Exam: Vitals:   01/16/17 1100 01/16/17 1424  BP: 132/81 129/72  Pulse:  95  Resp:  16  Temp:  98 F (36.7 C)  SpO2:  100%   Vitals:   01/16/17 0220 01/16/17 0535 01/16/17 1100 01/16/17 1424  BP: 132/71 129/85 132/81 129/72  Pulse: 88 89  95  Resp: _0 Temp: 98.5 F (36.9 C) 98.3 F (36.8 C)  98 F (36.7 C)  TempSrc: Oral Oral  Oral  SpO2: 100% 99%  100%  Weight:      Height:        General: Pt is alert, awake, not in acute distress Cardiovascular: RRR, S1/S2 +, no rubs, no gallops Respiratory: CTA bilaterally, no wheezing, no rhonchi Abdominal: Soft, NT, ND, bowel sounds + Extremities: no edema, no cyanosis Neuro: 5/5 strength upper/lower extremities. No CN deficits    The results of significant diagnostics from this hospitalization (including imaging, microbiology, ancillary and laboratory) are listed below for reference.     Microbiology: No results found for this or any previous visit (from the past 240 hour(s)).   Labs: BNP (last 3 results) No results for input(s): BNP in the last 8760 hours. Basic Metabolic Panel:  Recent Labs Lab 01/14/17 2310 01/14/17 2319 01/15/17 0429  NA 128* 135 134*  K 4.2 4.2 4.1  CL 102 103 103  CO2 20*  --  22  GLUCOSE 423* 416* 336*  BUN _1 CREATININE 0.88 0.70 0.73  CALCIUM 9.8  --  10.1   Liver Function Tests:  Recent Labs Lab 01/14/17 2310  AST 17  ALT 12*  ALKPHOS 84  BILITOT 0.4  PROT 7.2  ALBUMIN 3.9   No results for input(s): LIPASE, AMYLASE in the last 168 hours. No results for input(s): AMMONIA in the last 168 hours. CBC:  Recent Labs Lab 01/14/17 2310 01/14/17 2319 01/15/17 0429  WBC 9.2  --  9.5  NEUTROABS 6.1  --   --   HGB 8.7* 11.2* 8.4*  HCT 30.6* 33.0* 28.0*  MCV 69.9*  --  69.5*  PLT 519*  --  490*   Cardiac Enzymes: No results for input(s): CKTOTAL, CKMB, CKMBINDEX, TROPONINI in the last 168 hours. BNP: Invalid input(s):  POCBNP CBG:  Recent Labs Lab 01/15/17 1721 01/15/17 2138 01/16/17 0753 01/16/17 1200 01/16/17 1729  GLUCAP 224* 220* 187* 269* 213*   D-Dimer No results for input(s): DDIMER in the last 72 hours. Hgb A1c  Recent Labs  01/15/17 0430  HGBA1C 10.5*   Lipid Profile  Recent Labs  01/15/17 1110 01/16/17 0439  CHOL 176 168  HDL 38* 34*  LDLCALC 118* 110*  TRIG 102 119  CHOLHDL 4.6 4.9    Time coordinating discharge: Over 30 minutes  SIGNED:   Cordelia Poche, MD [facility] 01/17/2017, 2:50 PM Pager [phone]  If 7PM-7AM, please contact night-coverage [email or web address] Password [email or web address]

## 2017-01-16 NOTE — Progress Notes (Signed)
Inpatient Diabetes Program Recommendations  AACE/ADA: New Consensus Statement on Inpatient Glycemic Control (2015)  Target Ranges:  Prepandial:   less than 140 mg/dL      Peak postprandial:   less than 180 mg/dL (1-2 hours)      Critically ill patients:  140 - 180 mg/dL   Lab Results  Component Value Date   GLUCAP 269 (H) 01/16/2017   HGBA1C 10.5 (H) 01/15/2017    Review of Glycemic Control  Spoke with pt and mother at length regarding new diagnosis of DM. Pt states she's been unusually tired, thirsty over past 3 months. Has been drinking sweet tea, juice, regular soda and eating large quantities of sweets. Discussed basic pathophysiology of DM.  Discussed A1C results (10.5%) and explained what an A1C is and informed patient that his current A1C indicates an average glucose of 250 mg/dl over the past 2-3 months. Discussed basic pathophysiology of DM Type 2, basic home care, importance of checking CBGs and maintaining good CBG control to prevent long-term and short-term complications.Reviewed signs and symptoms of hyperglycemia and hypoglycemia along with treatment for both. Discussed impact of nutrition, exercise, stress, sickness, and medications on diabetes control. Reviewed Living Well with diabetes booklet and encouraged patient to read through entire book. RNs to provide ongoing basic DM education at bedside with this patient and engage patient to actively check blood glucose.  Pt and mother state they prefer to go home on OHAs and give diet and exercise a chance. Would rather not start on insulin before making lifestyle changes to control blood sugars. Has appt with PCP on October 9th.   Paged MD to discuss.  Thank you. Lorenda Peck, RD, LDN, CDE Inpatient Diabetes Coordinator [phone]

## 2017-01-16 NOTE — Progress Notes (Signed)
*  PRELIMINARY RESULTS* Vascular Ultrasound Carotid Duplex (Doppler) has been completed.  Preliminary findings: Bilateral: No significant (1-39%) ICA stenosis. Antegrade vertebral flow.    Landry Mellow, RDMS, RVT  01/16/2017, 2:46 PM

## 2017-01-16 NOTE — Progress Notes (Signed)
  Echocardiogram 2D Echocardiogram has been performed.  Merrie Roof F 01/16/2017, 4:50 PM

## 2017-01-16 NOTE — Progress Notes (Signed)
STROKE TEAM PROGRESS NOTE   HISTORY OF PRESENT ILLNESS (per record) Kelly Davies is a 46 y.o. female who is in her normal state of health until around 10:30 when she began having numbness and tingling in her left hand. She states that started in her hand and moved upwards and then she began having jerking movements of her arm. She describes her entire arm jerking. She is not sure if her face or leg were normal. The jerking lasted for approximately 1 minute and for about 20-30 minutes following that she had left-sided weakness. Due to the left-sided weakness, EMS activated code stroke, but she was resolved by the time of arrival to the emergency department with the exception of very mild tingling in her fingertips on the left.   LKW: 10:30 PM tpa given?: no, resolution of symptoms   SUBJECTIVE (INTERVAL HISTORY) Her mother is at the bedside.  She states she had 1 minute episode of focal left upper extremity jerking with tingling numbness without headache or any other associated symptoms. She has no prior history of seizures significant head injury. No history of DVT, pulmonary embolism, no family history of stroke or heart attacks at a young age. OBJECTIVE Temp:  [98.3 F (36.8 C)-98.9 F (37.2 C)] 98.3 F (36.8 C) (10/04 0535) Pulse Rate:  [88-93] 89 (10/04 0535) Cardiac Rhythm: Normal sinus rhythm (10/04 0700) Resp:  [18-20] 20 (10/04 0535) BP: (129-155)/(71-92) 132/81 (10/04 1100) SpO2:  [98 %-100 %] 99 % (10/04 0535)  CBC:   Recent Labs Lab 01/14/17 2310 01/14/17 2319 01/15/17 0429  WBC 9.2  --  9.5  NEUTROABS 6.1  --   --   HGB 8.7* 11.2* 8.4*  HCT 30.6* 33.0* 28.0*  MCV 69.9*  --  69.5*  PLT 519*  --  490*    Basic Metabolic Panel:   Recent Labs Lab 01/14/17 2310 01/14/17 2319 01/15/17 0429  NA 128* 135 134*  K 4.2 4.2 4.1  CL 102 103 103  CO2 20*  --  22  GLUCOSE 423* 416* 336*  BUN 10 10 10   CREATININE 0.88 0.70 0.73  CALCIUM 9.8  --  10.1    Lipid  Panel:     Component Value Date/Time   CHOL 168 01/16/2017 0439   TRIG 119 01/16/2017 0439   HDL 34 (L) 01/16/2017 0439   CHOLHDL 4.9 01/16/2017 0439   VLDL 24 01/16/2017 0439   LDLCALC 110 (H) 01/16/2017 0439   HgbA1c:  Lab Results  Component Value Date   HGBA1C 10.5 (H) 01/15/2017   Urine Drug Screen: No results found for: LABOPIA, COCAINSCRNUR, LABBENZ, AMPHETMU, THCU, LABBARB  Alcohol Level No results found for: ETH  IMAGING  Ct Angio Head W Or Wo Contrast Ct Angio Neck W Or Wo Contrast 01/15/2017 IMPRESSION: 1. Focal enhancement abnormality of the proximal right common carotid artery, at approximately the level of the subglottic larynx. Given the somewhat discontinuous appearance, I suspect that this is artifact, perhaps related to swallowing or laryngeal motion. However, in the context of multifocal acute ischemia within the right cerebral hemisphere, MRA of the neck with contrast is recommended for confirmation. 2. Otherwise normal examination of the arteries of the head and neck. 3. Prominent lower pharyngeal soft tissues with positional narrowing of the airway, as may be seen in the setting of sleep apnea.  Mr Brain Wo Contrast 01/15/2017 IMPRESSION: 1. Multiple tiny likely acute RIGHT frontoparietal (ACA and MCA territory). 2. Otherwise normal noncontrast MRI of the head.  Ct Head Code Stroke Wo Contrast 01/14/2017 IMPRESSION: 1. Normal noncontrast CT HEAD. 2. ASPECTS is 10.     PHYSICAL EXAM Frail young African-American lady not in distress. . Afebrile. Head is nontraumatic. Neck is supple without bruit.    Cardiac exam no murmur or gallop. Lungs are clear to auscultation. Distal pulses are well felt. Neurological Exam ;  Awake  Alert oriented x 3. Normal speech and language.eye movements full without nystagmus.fundi were not visualized. Vision acuity and fields appear normal. Hearing is normal. Palatal movements are normal. Face symmetric. Tongue midline. Normal  strength, tone, reflexes and coordination. Diminished fine finger movements in the left hand. Orbits right over left approximately. Normal sensation. Gait deferred.  ASSESSMENT/PLAN Kelly Davies is a 46 y.o. female with no known medical problems due to not seeing a doctor presenting with left sided weakness and tremor. She did not receive IV t-PA due to symptoms fully resolving. She presented with focal left upper extremity jerking likely due to simple partial seizure related to MRI abnormality of tiny right frontal and parietal infarcts  Stroke: multiple tiny right frontoparietal infarcts secondary to  embolic source unidentified that she does have  uncontrolled hypertension, diabetes, hyperlipidemia  Resultant  none  CT head No acute abnormalities  MRI head multiple likely acute tiny right frontoparietal infarcts  CTA Head No significant large vessel intracranial disease  CTA Neck Focal enhancement abnormality of proximal right common carotid artery, artifact favored vs pathologic  Carotid Doppler Needed due to artifact on CTA neck  2D Echo  pending  EEG No epileptiform activity noted  LDL 110  HgbA1c 10.5%  Enoxaparin for VTE prophylaxis Diet Carb Modified Fluid consistency: Thin; Room service appropriate? Yes  No antithrombotic prior to admission, now on aspirin 325 mg daily  Patient counseled to be compliant with her antithrombotic medications  Ongoing aggressive stroke risk factor management  Therapy recommendations:  pending  Disposition:  Home  Hypertension  Unstable  Permissive hypertension (OK if < 220/120) but gradually normalize in 5-7 days  Long-term BP goal normotensive <130/90  Hyperlipidemia  Home meds: none  LDL 110, goal < 70  Add atorvastatin 40mg   Continue statin at discharge  Diabetes  HgbA1c 10.5%, goal < 7.0  Uncontrolled  Other Stroke Risk Factors  Obesity, Body mass index is 38.72 kg/m., recommend weight loss, diet and  exercise as appropriate   ?Obstructive sleep apnea - consider if Hx appropriate or resistant HTN  Other Active Problems  Partial Seizure - unclear if causing acute MRI findings or secondary to acute stroke. She will continue on Keppra 500mg  BID for prophylaxis  Hospital day # 1  I have personally examined this patient, reviewed notes, independently viewed imaging studies, participated in medical decision making and plan of care.ROS completed by me personally and pertinent positives fully documented  I have made any additions or clarifications directly to the above note. She presented with simple partial seizure with left upper extremity jerking and paresthesias secondary to embolic right MCA branch infarcts. Etiology likely embolic given though she has uncontrolled risk factors of diabetes hypertension hyperlipidemia. Recommend aspirin for stroke prevention and check TEE and cardiac monitoring. Start Keppra for seizures and change IV to oral form. Check carotid ultrasound to evaluate right common carotid artery artifact. Long discussion with patient and mother and answered questions.I spent  35 minutes in total face-to-face time with the patient, more than 50% of which was spent in counseling and coordination of care, reviewing test results,  reviewing medication and discussing or reviewing the diagnosis of  Embolic stroke and partial seizure  , the prognosis and treatment options.   Antony Contras, MD Medical Director [place] Pager: [phone] 01/16/2017 4:02 PM  To contact Stroke Continuity provider, please refer to [email or web address] After hours, contact General Neurology

## 2017-01-16 NOTE — Discharge Instructions (Signed)
[place] were admitted after having a seizure and found to have multiple small strokes. You were also found to have mild heart dysfunction and new-onset diabetes. You will need a study of your heart, for which the cardiologist's office will call you. Please take your medications as prescribed.

## 2017-01-16 NOTE — Progress Notes (Signed)
Patient instructed on how to give insulin injection. Patient able to verbalize understanding and explain back. Insulin injection administered correctly by patient.

## 2017-01-16 NOTE — Evaluation (Signed)
Occupational Therapy Evaluation Patient Details Name: Kelly Davies MRN: [id number] DOB: 08-05-1970 Today's Date: 01/16/2017    History of Present Illness 46 year old woman with left sided tremor followed by weakness and confusion. MRI revealed Multiple tiny likely acute RIGHT frontoparietal (ACA and MCA teritories).   Clinical Impression   Pt admitted with the above diagnoses and presents with below problem list. 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 Pt presents at/near baseline with ADLs. Advised pt to ask MD if medically cleared to resume driving. No further acute OT needs indicated. OT signing off.     Follow Up Recommendations  No OT follow up    Equipment Recommendations  None recommended by OT    Recommendations for Other Services       Precautions / Restrictions Restrictions Weight Bearing Restrictions: No      Mobility Bed Mobility               General bed mobility comments: OOB on OT arrival  Transfers Overall transfer level: Independent Equipment used: None                  Balance Overall balance assessment: No apparent balance deficits (not formally assessed)                                         ADL either performed or assessed with clinical judgement   ADL Overall ADL's : Independent                                       General ADL Comments: Pt showering upon inital OT attempt. Completed community distance functional mobility and stairs. Pt appears to be at baseline.      Vision Baseline Vision/History: No visual deficits Patient Visual Report: No change from baseline Vision Assessment?: No apparent visual deficits     Perception     Praxis      Pertinent Vitals/Pain Pain Assessment: No/denies pain     Hand Dominance Right   Extremity/Trunk Assessment Upper Extremity Assessment Upper Extremity Assessment: Overall WFL for tasks  assessed;LUE deficits/detail LUE Deficits / Details: reports residual impaired sensation to 3rd digit only   Lower Extremity Assessment Lower Extremity Assessment: Overall WFL for tasks assessed       Communication Communication Communication: No difficulties   Cognition Arousal/Alertness: Awake/alert Behavior During Therapy: WFL for tasks assessed/performed Overall Cognitive Status: Within Functional Limits for tasks assessed                                     General Comments       Exercises     Shoulder Instructions      Home Living Family/patient expects to be discharged to:: Private residence Living Arrangements: Parent   Type of Home: House Home Access: Stairs to enter Technical brewer of Steps: 6 Entrance Stairs-Rails: Right;Left Home Layout: Two level;Bed/bath upstairs Alternate Level Stairs-Number of Steps: 10,  landing, 10 Alternate Level Stairs-Rails: Right;Left Bathroom Shower/Tub: Walk-in shower         Home Equipment: None   Additional Comments: 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; works in [employer], drives      Prior  Functioning/Environment Level of Independence: Independent        Comments: 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; works in [employer], drives        OT Problem List:        OT Treatment/Interventions:      OT Goals(Current goals can be found in the care plan section) Acute Rehab OT Goals Patient Stated Goal: home  OT Frequency:     Barriers to D/C:            Co-evaluation              AM-PAC PT "6 Clicks" Daily Activity     Outcome Measure Help from another person eating meals?: None Help from another person taking care of personal grooming?: None Help from another person toileting, which includes using toliet, bedpan, or urinal?: None Help from another person bathing (including washing, rinsing, drying)?: None Help from another person to put on and taking off regular upper body clothing?: None Help from  another person to put on and taking off regular lower body clothing?: None 6 Click Score: 24   End of Session    Activity Tolerance: Patient tolerated treatment well Patient left: in bed;Other (comment);with call bell/phone within reach;with family/visitor present (EOB)  OT Visit Diagnosis: Unsteadiness on feet (R26.81)                Time: 4193-7902 OT Time Calculation (min): 10 min Charges:  OT General Charges $OT Visit: 1 Visit OT Evaluation $OT Eval Low Complexity: 1 Low G-Codes:       Hortencia Pilar 01/16/2017, 12:52 PM

## 2017-01-16 NOTE — Evaluation (Signed)
Physical Therapy Evaluation & Discharge Patient Details Name: Kelly Davies MRN: [id number] DOB: 03/03/1971 Today's Date: 01/16/2017   History of Present Illness  Pt is a 46 year old woman with left sided tremor followed by weakness and confusion. MRI revealed Multiple tiny likely acute RIGHT frontoparietal (ACA and MCA teritories).  Clinical Impression  Pt presented supine in bed with HOB elevated, awake and willing to participate in therapy session. Prior to admission, pt reported that she was independent with all functional mobility and ADLs. 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 Pt ambulated in hallway with mod I, no use of an AD with no instability or LOB. 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 Pt reported that she feels back to her baseline in regards to functional mobility. No further acute PT needs identified at this time. PT signing off.    Follow Up Recommendations No PT follow up    Equipment Recommendations  None recommended by PT    Recommendations for Other Services       Precautions / Restrictions Restrictions Weight Bearing Restrictions: No      Mobility  Bed Mobility Overal bed mobility: Independent             General bed mobility comments: OOB on OT arrival  Transfers Overall transfer level: Independent Equipment used: None                Ambulation/Gait Ambulation/Gait assistance: Modified independent (Device/Increase time) Ambulation Distance (Feet): 400 Feet Assistive device: None Gait Pattern/deviations: WFL(Within Functional Limits) Gait velocity: WFL Gait velocity interpretation: at or above normal speed for age/gender General Gait Details: no instability or LOB, pt participated in higher level balance assessment with no issues  Stairs            Wheelchair Mobility    Modified Rankin (Stroke Patients Only) Modified  Rankin (Stroke Patients Only) Pre-Morbid Rankin Score: No symptoms Modified Rankin: No significant disability (residual L 3rd digit impaired sensation)     Balance Overall balance assessment: Needs assistance Sitting-balance support: Feet supported Sitting balance-Leahy Scale: Normal     Standing balance support: No upper extremity supported Standing balance-Leahy Scale: Good                   Standardized Balance Assessment Standardized Balance Assessment : Dynamic Gait Index   Dynamic Gait Index Level Surface: Normal Change in Gait Speed: Normal Gait with Horizontal Head Turns: Normal Gait with Vertical Head Turns: Normal Gait and Pivot Turn: Normal Step Over Obstacle: Normal Step Around Obstacles: Normal Steps: Normal Total Score: 24       Pertinent Vitals/Pain Pain Assessment: No/denies pain    Home Living Family/patient expects to be discharged to:: Private residence Living Arrangements: Parent   Type of Home: House Home Access: Stairs to enter Entrance Stairs-Rails: Psychiatric nurse of Steps: 6 Home Layout: Two level;Bed/bath upstairs Home Equipment: None Additional Comments: 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; works in [employer], drives    Prior Function Level of Independence: Independent         Comments: 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; works in [employer], Manufacturing engineer Dominance   Dominant Hand: Right    Extremity/Trunk Assessment   Upper Extremity Assessment Upper Extremity Assessment: Defer to OT evaluation LUE Deficits / Details: reports residual impaired sensation to 3rd digit only    Lower  Extremity Assessment Lower Extremity Assessment: Overall WFL for tasks assessed    Cervical / Trunk Assessment Cervical / Trunk Assessment: Normal  Communication   Communication: No difficulties  Cognition Arousal/Alertness: Awake/alert Behavior During Therapy: WFL for tasks assessed/performed Overall Cognitive Status: Within  Functional Limits for tasks assessed                                        General Comments      Exercises     Assessment/Plan    PT Assessment Patent does not need any further PT services  PT Problem List         PT Treatment Interventions      PT Goals (Current goals can be found in the Care Plan section)  Acute Rehab PT Goals Patient Stated Goal: return home    Frequency     Barriers to discharge        Co-evaluation               AM-PAC PT "6 Clicks" Daily Activity  Outcome Measure Difficulty turning over in bed (including adjusting bedclothes, sheets and blankets)?: None Difficulty moving from lying on back to sitting on the side of the bed? : None Difficulty sitting down on and standing up from a chair with arms (e.g., wheelchair, bedside commode, etc,.)?: None Help needed moving to and from a bed to chair (including a wheelchair)?: None Help needed walking in hospital room?: None Help needed climbing 3-5 steps with a railing? : None 6 Click Score: 24    End of Session   Activity Tolerance: Patient tolerated treatment well Patient left: in bed;with call bell/phone within reach;with family/visitor present;Other (comment) (sitting EOB to eat lunch) Nurse Communication: Mobility status PT Visit Diagnosis: Other symptoms and signs involving the nervous system (F02.637)    Time: 8588-5027 PT Time Calculation (min) (ACUTE ONLY): 10 min   Charges:   PT Evaluation $PT Eval Low Complexity: 1 Low     PT G Codes:        Osgood, PT, DPT [facility] [place] 01/16/2017, 1:45 PM

## 2017-01-16 NOTE — Progress Notes (Signed)
  Echocardiogram 2D Echocardiogram has been performed.  Merrie Roof F 01/16/2017, 4:49 PM

## 2017-01-24 ENCOUNTER — Other Ambulatory Visit: Payer: Self-pay

## 2017-01-24 NOTE — Patient Outreach (Signed)
[facility]) Care Management  01/24/2017  Kelly Davies 10-15-1970 [id number]   EMMI:  Stroke  Referral Date: 01/24/17 Referral Source: EMMI Dashboard Referral Reason: Feeling Worse Overall?- yes.  Smoked or been around smoke?-yes Day # 6   Outreach attempt # 1 to patient. No answer.  HIPAA compliant voice message left.   Plan: RN CM will outreach to patient again within the next business day.  Jone Baseman, RN, MSN [facility] Care Management Care Management Telephonic Coordinator Direct Line [phone] Toll Free: [phone]  Fax: [phone]

## 2017-01-24 NOTE — Patient Outreach (Addendum)
[facility]) Care Management  01/24/2017  Kelly Davies [birth year] [id number]  EMMI: Stroke Referral Date: 01/24/17 Referral Source: Dashboard Referral Reason:Feeling worse overall-yes. Smoked or been around smoke-yes Day # 6   Incoming call Spoke with patient she is able to verify HIPAA. Patient reports she was feeling some overall tiredness and her left arm was feeling heavy. Patient states she declined physical therapy and really thinks she needs some now but tried to reach her therapist but could not reach anybody. Patient provided with information to reach her therapist.  RN CM also advised patient that she could reach out to her primary care physician since she is discharged and they could assist her in getting home health ordered.  She verbalized understanding.   Patient reports she saw her Nurse Practitioner Vinnie Level who works with Dr. Lenna Gilford on Tuesday for follow up from hospitalization. Patient denies any other needs at this time.    Advised patient that they would continue to get automated EMMI-Stroke post discharge calls to assess how they are doing following recent hospitalization and will receive a call from a nurse if any of their responses were abnormal. Patient voiced understanding and was appreciative of f/u call.  Plan:  RN CM will close case at this time as patient has been assessed and not further intervention needed and will notify care management assistant of case status.     Jone Baseman, RN, MSN [facility] Care Management Care Management Telephonic Coordinator Direct Line [phone] Toll Free: [phone]  Fax: [phone]

## 2017-01-24 NOTE — Telephone Encounter (Signed)
This encounter was created in error - please disregard.

## 2017-01-27 ENCOUNTER — Ambulatory Visit: Payer: Self-pay

## 2017-01-28 ENCOUNTER — Telehealth: Payer: Self-pay | Admitting: Neurology

## 2017-01-28 ENCOUNTER — Emergency Department (HOSPITAL_COMMUNITY): Payer: 59

## 2017-01-28 ENCOUNTER — Emergency Department (HOSPITAL_COMMUNITY)
Admission: EM | Admit: 2017-01-28 | Discharge: 2017-01-28 | Disposition: A | Discharge status: Home / Self Care | Payer: 59 | Attending: Emergency Medicine | Admitting: Emergency Medicine

## 2017-01-28 ENCOUNTER — Encounter (HOSPITAL_COMMUNITY): Payer: Self-pay | Admitting: Nurse Practitioner

## 2017-01-28 ENCOUNTER — Telehealth: Payer: Self-pay

## 2017-01-28 DIAGNOSIS — I639 Cerebral infarction, unspecified: Secondary | ICD-10-CM | POA: Diagnosis not present

## 2017-01-28 DIAGNOSIS — E119 Type 2 diabetes mellitus without complications: Secondary | ICD-10-CM | POA: Diagnosis not present

## 2017-01-28 DIAGNOSIS — I10 Essential (primary) hypertension: Secondary | ICD-10-CM | POA: Diagnosis not present

## 2017-01-28 DIAGNOSIS — H538 Other visual disturbances: Secondary | ICD-10-CM | POA: Diagnosis present

## 2017-01-28 HISTORY — DX: Type 2 diabetes mellitus without complications: E11.9

## 2017-01-28 HISTORY — DX: Essential (primary) hypertension: I10

## 2017-01-28 HISTORY — DX: Unspecified convulsions: R56.9

## 2017-01-28 HISTORY — DX: Pure hypercholesterolemia, unspecified: E78.00

## 2017-01-28 HISTORY — DX: Transient cerebral ischemic attack, unspecified: G45.9

## 2017-01-28 LAB — CBC
HCT: 28.2 % — ABNORMAL LOW (ref 36.0–46.0)
HEMOGLOBIN: 8 g/dL — AB (ref 12.0–15.0)
MCH: 20.2 pg — ABNORMAL LOW (ref 26.0–34.0)
MCHC: 28.4 g/dL — ABNORMAL LOW (ref 30.0–36.0)
MCV: 71 fL — ABNORMAL LOW (ref 78.0–100.0)
Platelets: 676 10*3/uL — ABNORMAL HIGH (ref 150–400)
RBC: 3.97 MIL/uL (ref 3.87–5.11)
RDW: 17 % — ABNORMAL HIGH (ref 11.5–15.5)
WBC: 6.8 10*3/uL (ref 4.0–10.5)

## 2017-01-28 LAB — DIFFERENTIAL
BASOS ABS: 0.1 10*3/uL (ref 0.0–0.1)
Basophils Relative: 1 %
Eosinophils Absolute: 0 10*3/uL (ref 0.0–0.7)
Eosinophils Relative: 0 %
LYMPHS PCT: 24 %
Lymphs Abs: 1.6 10*3/uL (ref 0.7–4.0)
MONOS PCT: 11 %
Monocytes Absolute: 0.7 10*3/uL (ref 0.1–1.0)
NEUTROS PCT: 64 %
Neutro Abs: 4.4 10*3/uL (ref 1.7–7.7)

## 2017-01-28 LAB — COMPREHENSIVE METABOLIC PANEL
ALBUMIN: 3.7 g/dL (ref 3.5–5.0)
ALK PHOS: 52 U/L (ref 38–126)
ALT: 13 U/L — AB (ref 14–54)
ANION GAP: 7 (ref 5–15)
AST: 18 U/L (ref 15–41)
BUN: 12 mg/dL (ref 6–20)
CALCIUM: 10.1 mg/dL (ref 8.9–10.3)
CO2: 22 mmol/L (ref 22–32)
CREATININE: 0.91 mg/dL (ref 0.44–1.00)
Chloride: 106 mmol/L (ref 101–111)
GFR calc Af Amer: >60 mL/min (ref >60)
GFR calc non Af Amer: >60 mL/min (ref >60)
GLUCOSE: 119 mg/dL — AB (ref 65–99)
Potassium: 4 mmol/L (ref 3.5–5.1)
SODIUM: 135 mmol/L (ref 135–145)
Total Bilirubin: 0.4 mg/dL (ref 0.3–1.2)
Total Protein: 7.3 g/dL (ref 6.5–8.1)

## 2017-01-28 LAB — I-STAT CHEM 8, ED
BUN: 14 mg/dL (ref 6–20)
CALCIUM ION: 1.31 mmol/L (ref 1.15–1.40)
CHLORIDE: 105 mmol/L (ref 101–111)
Creatinine, Ser: 0.7 mg/dL (ref 0.44–1.00)
Glucose, Bld: 118 mg/dL — ABNORMAL HIGH (ref 65–99)
HCT: 29 % — ABNORMAL LOW (ref 36.0–46.0)
Hemoglobin: 9.9 g/dL — ABNORMAL LOW (ref 12.0–15.0)
POTASSIUM: 4.1 mmol/L (ref 3.5–5.1)
SODIUM: 138 mmol/L (ref 135–145)
TCO2: 23 mmol/L (ref 22–32)

## 2017-01-28 LAB — APTT: APTT: 32 s (ref 24–36)

## 2017-01-28 LAB — I-STAT TROPONIN, ED: Troponin i, poc: 0 ng/mL (ref 0.00–0.08)

## 2017-01-28 LAB — PROTIME-INR
INR: 1.11
Prothrombin Time: 14.2 seconds (ref 11.4–15.2)

## 2017-01-28 NOTE — Telephone Encounter (Signed)
Pt states she did see Dr Leonie Man when she was in the hospital, she asked if she could see him ealier than 11-20 because of blurred vision.  Pt was advised Dr Leonie Man does not show to have anything available before that date.  Pt asked this message be sent to his RN that her left arm is very hard to left and takes effort to lift.  Pt was advised to use ED if she felt the need.  Please call

## 2017-01-28 NOTE — Telephone Encounter (Signed)
Rn was on the phone about pt seeking ED for blurred vision. Pt stated her job fax a disability form for her recent hospitalization. Rn stated Dr Kelly Davies is working in the hospital this week,and will get the form on Monday. Pt verbalized understanding. Rn also stated she can always call to be seen sooner for cancellations. Pt has appt and was advised to call for any sooner appts.

## 2017-01-28 NOTE — ED Notes (Signed)
Pt to MRI

## 2017-01-28 NOTE — Telephone Encounter (Signed)
Rn call patient about having symptoms of blurred vision.Pt was just discharge 01/17/2017 from hospital for stroke. Pt stated her left arm arm was heavy, and weak at times. Pt stated she is going to therapy for her arm. PT stated the blurred vision has been for a couple of days. Rn advised pt to seek the nearest ED because this is a new symptom. Rn advised her that we dont triage urgent stroke sympts, we are a doctors office. Pt verbalized to seek ED for blurred vision.

## 2017-01-28 NOTE — ED Triage Notes (Signed)
Per EMS pt from home noted two days ago that she started having intermittent headaches and bilateral blurry vision more notable with near vision. Pt noted fatigue, and nausea as well. Yesterday afternoon patient noted left arm heaviness that has dissipated after hospital discharge had returned. Patient denies worsening weakness. No drift, facial droop, slurring noted.

## 2017-01-28 NOTE — ED Notes (Signed)
Pt returned from MRI °

## 2017-01-28 NOTE — ED Provider Notes (Signed)
  Physical Exam  BP (!) 102/59   Pulse 92   Temp 99.9 F (37.7 C) (Oral)   Resp 13   Ht 5\' 2"  (1.575 m)   Wt 93.9 kg (207 lb)   LMP 01/28/2017   SpO2 99%   BMI 37.86 kg/m   Physical Exam  ED Course  Procedures  MDM MRI results reviewed and discussed with Dr. Malen Gauze from neurology. Thinks this is likely an evolution of the previous stroke. Does not appear to be an new infarct. Reviewed CTA of the neck and Doppler to clear up the artifact was on it. Discussed with patient should be discharged to follow-up stroke M.D. as planned.       Davonna Belling, MD 01/28/17 [phone]

## 2017-01-28 NOTE — ED Provider Notes (Signed)
[facility] EMERGENCY DEPARTMENT Provider Note   CSN: [id number] Arrival date & time: 01/28/17  1143     History   Chief Complaint Chief Complaint  Patient presents with  . Blurred Vision    HPI Kelly Davies is a 46 y.o. female.  HPI   Patient is a 46 year old female with past medical history of TIA, partial seizure, diabetes and recent admission for press. Patient followed by Dr. Leonie Man. She called her neurologist. She had return of symptoms this morning with weakness in her left hand and Blurry vision. She reports this been going on since last night. She reports headache as well.  Dr. Leonie Man told her to come to the emergency department to be evaluated.   Past Medical History:  Diagnosis Date  . Diabetes mellitus without complication ([place])   . Hypercholesteremia   . Hypertension   . Partial seizure ([place])   . TIA (transient ischemic attack)     Patient Active Problem List   Diagnosis Date Noted  . Diastolic dysfunction without heart failure 01/16/2017  . Hypertension 01/16/2017  . New onset type 2 diabetes mellitus ([place]) 01/15/2017  . Hypertensive urgency 01/15/2017  . Seizure ([place]) 01/15/2017  . Acute CVA (cerebrovascular accident) ([place]) 01/15/2017    History reviewed. No pertinent surgical history.  OB History    No data available       Home Medications    Prior to Admission medications   Medication Sig Start Date End Date Taking? Authorizing Provider  amLODipine (NORVASC) 5 MG tablet Take 1 tablet (5 mg total) by mouth daily. 01/17/17  Yes Mariel Aloe, MD  aspirin 325 MG tablet Take 1 tablet (325 mg total) by mouth daily. 01/17/17  Yes Mariel Aloe, MD  atorvastatin (LIPITOR) 40 MG tablet Take 1 tablet (40 mg total) by mouth daily at 6 PM. 01/17/17  Yes Mariel Aloe, MD  empagliflozin (JARDIANCE) 25 MG TABS tablet Take 25 mg by mouth daily. 01/21/17 01/21/18 Yes [provider]  insulin glargine (LANTUS) 100 unit/mL  SOPN Inject 0.1 mLs (10 Units total) into the skin at bedtime. 01/16/17  Yes Mariel Aloe, MD  levETIRAcetam (KEPPRA) 500 MG tablet Take 1 tablet (500 mg total) by mouth 2 (two) times daily. 01/16/17  Yes Mariel Aloe, MD  perindopril (ACEON) 8 MG tablet Take 4 mg by mouth at bedtime. 01/21/17  Yes [provider]  sitaGLIPtin-metformin (JANUMET) 50-500 MG tablet Take 1 tablet by mouth 2 (two) times daily. 01/21/17 01/21/18 Yes [provider]    Family History No family history on file.  Social History Social History  Substance Use Topics  . Smoking status: Never Smoker  . Smokeless tobacco: Never Used  . Alcohol use No     Allergies   Patient has no known allergies.   Review of Systems Review of Systems  Constitutional: Negative for activity change.  Eyes: Positive for visual disturbance.  Respiratory: Negative for shortness of breath.   Cardiovascular: Negative for chest pain.  Gastrointestinal: Negative for abdominal pain.  Neurological: Positive for weakness, light-headedness and headaches.     Physical Exam Updated Vital Signs BP 119/77 (BP Location: Right Arm)   Pulse (!) 109   Temp 99.9 F (37.7 C) (Oral)   Resp 18   Ht 5\' 2"  (1.575 m)   Wt 93.9 kg (207 lb)   LMP 01/28/2017   SpO2 99%   BMI 37.86 kg/m   Physical Exam  Constitutional: She is  oriented to person, place, and time. She appears well-developed and well-nourished.  HENT:  Head: Normocephalic and atraumatic.  Eyes: Right eye exhibits no discharge.  Cardiovascular: Normal rate and regular rhythm.   Pulmonary/Chest: Effort normal and breath sounds normal.  Neurological: She is oriented to person, place, and time. Coordination normal.  Blurry vision  Equal strength bilaterally upper and lower extremities negative pronator drift. Normal sensation bilaterally. Speech comprehensible, no slurring. Facial nerve tested and appears grossly normal. Alert and oriented 3.   Skin: Skin  is warm and dry. She is not diaphoretic.  Psychiatric: She has a normal mood and affect.  Nursing note and vitals reviewed.    ED Treatments / Results  Labs (all labs ordered are listed, but only abnormal results are displayed) Labs Reviewed  I-STAT CHEM 8, ED - Abnormal; Notable for the following:       Result Value   Glucose, Bld 118 (*)    Hemoglobin 9.9 (*)    HCT 29.0 (*)    All other components within normal limits  PROTIME-INR  APTT  CBC  DIFFERENTIAL  COMPREHENSIVE METABOLIC PANEL  I-STAT TROPONIN, ED    EKG  EKG Interpretation None       Radiology No results found.  Procedures Procedures (including critical care time)  Medications Ordered in ED Medications - No data to display   Initial Impression / Assessment and Plan / ED Course  I have reviewed the triage vital signs and the nursing notes.  Pertinent labs & imaging results that were available during my care of the patient were reviewed by me and considered in my medical decision making (see chart for details).     Patient is a 46 year old female with past medical history of TIA, partial seizure, diabetes and recent admission for press. Patient followed by Dr. Leonie Man. She called her neurologist. She had return of symptoms this morning with weakness in her left hand and Blurry vision. She reports this been going on since last night. She reports headache as well.  Dr. Leonie Man told her to come to the emergency department to be evaluated.   12:32 PM   Discussed with neurology. We'll order MRI given that she has had question l embolic vs small strokes on previous MRIs. Signed out to oncoming provider.    Final Clinical Impressions(s) / ED Diagnoses   Final diagnoses:  None    New Prescriptions New Prescriptions   No medications on file     Macarthur Critchley, MD 02/03/17 1456

## 2017-01-28 NOTE — Discharge Instructions (Signed)
Follow-up with cardiology and Dr. Leonie Man as planned.

## 2017-02-04 ENCOUNTER — Telehealth: Payer: Self-pay | Admitting: *Deleted

## 2017-02-04 NOTE — Telephone Encounter (Signed)
Pt reed group form on [employer].

## 2017-02-05 ENCOUNTER — Telehealth: Payer: Self-pay

## 2017-02-05 ENCOUNTER — Telehealth: Payer: Self-pay | Admitting: *Deleted

## 2017-02-05 NOTE — Telephone Encounter (Signed)
Reed form complete. Given to Hilda Blades in medical records for processing,and payment.

## 2017-02-05 NOTE — Telephone Encounter (Signed)
I called, unable to reach pt. Pt form is ready for pick up.

## 2017-02-05 NOTE — Telephone Encounter (Signed)
Pt returned Debra's call. Msg relayed

## 2017-02-05 NOTE — Telephone Encounter (Signed)
Made in error

## 2017-02-05 NOTE — Telephone Encounter (Deleted)
Made in error

## 2017-02-06 DIAGNOSIS — Z0289 Encounter for other administrative examinations: Secondary | ICD-10-CM

## 2017-02-17 ENCOUNTER — Telehealth: Payer: Self-pay | Admitting: Nurse Practitioner

## 2017-02-17 NOTE — Telephone Encounter (Signed)
Received referral for ILR implant from neurology post stroke.  EKG's in Epic reviewed, no AF noted.  She has TEE scheduled with Dr Einar Gip 11/13. We can do ILR implant that day while she is in hospital with Dr Rayann Heman.  I have placed on cath schedule to hold spot for now. She will need consult day of procedure (Dr Rayann Heman and Joseph Art made aware) to ensure she would like to proceed with ILR implant.  Chanetta Marshall, NP 02/17/2017 8:51 AM

## 2017-02-17 NOTE — Telephone Encounter (Signed)
Patient returning call from [employer].

## 2017-02-17 NOTE — Telephone Encounter (Signed)
Spoke with patient. She is unsure she would like to proceed with ILR implant. She is currently wearing event monitor. She is willing to discuss further at time of TEE.  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  Chanetta Marshall, NP 02/17/2017 9:29 AM

## 2017-02-25 ENCOUNTER — Encounter (HOSPITAL_COMMUNITY): Admission: RE | Payer: Self-pay | Source: Ambulatory Visit

## 2017-02-25 ENCOUNTER — Ambulatory Visit (HOSPITAL_COMMUNITY): Admission: RE | Admit: 2017-02-25 | Payer: 59 | Source: Ambulatory Visit | Admitting: Internal Medicine

## 2017-02-25 SURGERY — ECHOCARDIOGRAM, TRANSESOPHAGEAL
Anesthesia: Moderate Sedation

## 2017-02-25 SURGERY — LOOP RECORDER INSERTION

## 2017-03-04 ENCOUNTER — Ambulatory Visit: Payer: 59 | Admitting: Neurology

## 2017-03-16 NOTE — H&P (Signed)
OFFICE VISIT NOTES COPIED TO EPIC FOR DOCUMENTATION  . History of Present Illness Kelly Davies; 02/05/17 10:12 AM) Patient words: NP EVAL for Hospital F/U.  The patient is a 46 year old female who presents with a complaint of Recent CVA. Patient referred for evaluation of recent stroke by hospital ED.  46year-old African-American female with new diagnosis of hypertension, at least metastatic to, who had a stroke and October 2016 with left arm weakness. She had another admission to the hospital with weakness which was thought to be evolution of prior stroke. She also thought to have partial seizures and possible PRES. She is awaiting her appointment with neurology. Cardiology opinion was sought PFO could be a potential cause for her stroke.  Workup for stroke and did not show any significant extracranial vascular abnormalities. MRI brain on 01/15/2017 showed multiple tiny likely acute RIGHT frontoparietal (ACA and MCA territory). She was started on treatment for the diagnosis of diabetes with A1c of 10.5. Blood sugars have been better controlled since then. 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 She does not do regular exercise. With her level of activity, she denies any chest pain or shortness of breath. She has had recent palpitations. There was no atrial fibrillation noted in the hospital.  Hospital echocardiogram 01/16/2017:  Study Conclusions  - Left ventricle: The cavity size was normal. Systolic function was normal. The estimated ejection fraction was in the range of 60% to 65%. Wall motion was normal; there were no regional wall motion abnormalities. Doppler parameters are consistent with abnormal left ventricular relaxation (grade 1 diastolic dysfunction). - Mitral valve: There was mild regurgitation. - Left atrium: The atrium was mildly dilated.  Impressions:  - No cardiac source of emboli was indentified.     Problem  List/Past Medical (Kelly Davies; Feb 05, 2017 9:17 AM) History of CVA (cerebrovascular accident) (F57.32) [20/2542]: Diastolic dysfunction without heart failure (I51.89)  Benign essential hypertension (I10)  New onset type 2 diabetes mellitus (E11.9)   Allergies (Kelly Davies; 02/05/2017 9:09 AM) No Known Drug Allergies [Feb 05, 2017]:  Family History (Kelly Davies; 02/05/2017 9:10 AM) Mother  Living; Had an MI in her 12's Father  Living; No known Heart conditions Brother 1  Older; Had an MI at age 65  Social History (Kelly Davies; Feb 05, 2017 9:10 AM) Current tobacco use  Never smoker. Non Drinker/No Alcohol Use  Marital status  Single. Living Situation  Lives with parents. Number of Children  0.  Past Surgical History (Kelly Davies; 02-05-17 9:10 AM) None [05-Feb-2017]:  Medication History (Kelly Davies; 02-05-17 9:14 AM) AmLODIPine Besylate (5MG Tablet, 1 Oral daily) Active. Aspirin EC (325MG Tablet DR, 1 Oral daily) Active. Atorvastatin Calcium (40MG Tablet, 1 Oral daily) Active. Jardiance (25MG Tablet, 1 Oral daily) Active. Lantus SoloStar (100UNIT/ML Soln Pen-inj, 10 Units Subcutaneous daily as needed) Active. LevETIRAcetam (500MG Tablet, 1 Oral two times daily) Active. Perindopril Erbumine (8MG Tablet, 1/2 - 1 Oral daily for BP) Active. Janumet (50-500MG Tablet, 1 Oral two times daily) Active. Medications Reconciled (Pt brought discharge list)  Diagnostic Studies History (Kelly Davies; 2017-02-05 9:11 AM) Echocardiogram [01/15/2017]: [facility]    Review of Systems [facility] Kelly Davies; 02/05/2017 10:10 AM) General Not Present- Appetite Loss and Weight Gain. Respiratory Not Present- Chronic Cough and Wakes up from Sleep Wheezing or Short of Breath. Cardiovascular Present- Hypertension and Palpitations. Not Present- Chest Pain, Difficulty Breathing Lying Down, Difficulty Breathing On Exertion, Edema, Paroxysmal Nocturnal Dyspnea and  Shortness of Breath. Gastrointestinal Not  Present- Black, Tarry Stool and Difficulty Swallowing. Musculoskeletal Not Present- Decreased Range of Motion and Muscle Atrophy. Neurological Not Present- Attention Deficit. Psychiatric Not Present- Personality Changes and Suicidal Ideation. Endocrine Not Present- Cold Intolerance and Heat Intolerance. Hematology Not Present- Abnormal Bleeding. All other systems negative  Vitals (Kelly Davies; 01/30/2017 9:19 AM) 01/30/2017 9:07 AM Weight: 205.13 lb Height: 62in Body Surface Area: 1.93 m Body Mass Index: 37.52 kg/m  Pulse: 115 (Regular)  P.OX: 99% (Room air) BP: 112/68 (Sitting, Left Arm, Standard)       Physical Exam (Kelly Davies; 01/30/2017 10:10 AM) General Mental Status-Alert. General Appearance-Cooperative and Appears stated age. Build & Nutrition-Moderately built.  Head and Neck Thyroid Gland Characteristics - normal size and consistency and no palpable nodules.  Chest and Lung Exam Chest and lung exam reveals -quiet, even and easy respiratory effort with no use of accessory muscles, non-tender and on auscultation, normal breath sounds, no adventitious sounds.  Cardiovascular Cardiovascular examination reveals -carotid auscultation reveals no bruits, abdominal aorta auscultation reveals no bruits and no prominent pulsation and femoral artery auscultation bilaterally reveals normal pulses, no bruits, no thrills. Auscultation Rhythm - Regular and Tachycardic.  Abdomen Palpation/Percussion Palpation and Percussion of the abdomen reveal - Non Tender and No hepatosplenomegaly.  Peripheral Vascular Lower Extremity Palpation - Dorsalis pedis pulse - Bilateral - 2+. Posterior tibial pulse - Bilateral - 1+. Carotid arteries - Bilateral-No Carotid bruit.  Neurologic Neurologic evaluation reveals -alert and oriented x 3 with no impairment of recent or remote memory. Motor-Grossly intact  without any focal deficits.  Musculoskeletal Global Assessment Left Lower Extremity - no deformities, masses or tenderness, no known fractures. Right Lower Extremity - no deformities, masses or tenderness, no known fractures.    Assessment & Plan Joya Gaskins Kelly Davies; 01/30/2017 10:12 AM) Laboratory examination (Z01.89) Story: Labs 01/28/2017: BUN/creatinine 14/0.7. EGFR >60 H/H9.9/29. Hemoglobin was 8.4 on 02/11/2017 Current Plans Complete electrocardiogram (93000) History of CVA (cerebrovascular accident) (L89.21) Diastolic dysfunction without heart failure (I51.89) New onset type 2 diabetes mellitus (E11.9) Benign essential hypertension (I10) Story: EKG 01/30/2017: Normal sinus rhythm 97 bpm. Normal axis. Short PR interval without clear delta wave. Otherwise normal conduction.  Note:.  Recommendations: She does have a factors for CAD including hypertension, diabetes mellitus, family history of premature CAD, her workup has not shown any obvious vascular source for her stroke. Transthoracic echocardiogram in the hospital did not have bubble study performed. It is prudent to perform a TEE to rule out a PFO as cause for her stroke. While there was no Afib noted during her two and half days of hospital stay, given her palpitations, I will also give her an event monitor for 30 days to rule out any atrial fibrillation. In the meantime, recommend aggressive risk factor modification including blood pressure and diabetes control. She is on lisinopril as well as oral therapy for her diabetes. She is also on atorvastatin 40 mg. I have talked to about diet and lifestyle changes. She is still tachycardic today. She did not have significant tachycardia while in the hospital. She is understandably anxious about today's visit. I will look at her heart rate on the event monitor and during the TEE.  Cc Stevenson Clinch, Davies Cc Antony Contras, Davies  Signed electronically by Kelly Leep, Davies  (01/30/2017 10:13 AM)

## 2017-03-18 ENCOUNTER — Encounter (HOSPITAL_COMMUNITY): Admission: RE | Payer: Self-pay | Source: Ambulatory Visit

## 2017-03-18 ENCOUNTER — Ambulatory Visit (HOSPITAL_COMMUNITY): Admission: RE | Admit: 2017-03-18 | Payer: 59 | Source: Ambulatory Visit | Admitting: Cardiology

## 2017-03-18 SURGERY — ECHOCARDIOGRAM, TRANSESOPHAGEAL
Anesthesia: Moderate Sedation

## 2017-03-20 ENCOUNTER — Ambulatory Visit (HOSPITAL_COMMUNITY)
Admission: RE | Admit: 2017-03-20 | Discharge: 2017-03-20 | Disposition: A | Discharge status: Home / Self Care | Payer: 59 | Source: Ambulatory Visit | Attending: Cardiology | Admitting: Cardiology

## 2017-03-20 ENCOUNTER — Other Ambulatory Visit: Payer: Self-pay

## 2017-03-20 ENCOUNTER — Encounter (HOSPITAL_COMMUNITY): Payer: Self-pay | Admitting: *Deleted

## 2017-03-20 ENCOUNTER — Encounter (HOSPITAL_COMMUNITY)
Admission: RE | Disposition: A | Discharge status: Home / Self Care | Payer: Self-pay | Source: Ambulatory Visit | Attending: Cardiology

## 2017-03-20 DIAGNOSIS — R000 Tachycardia, unspecified: Secondary | ICD-10-CM | POA: Insufficient documentation

## 2017-03-20 DIAGNOSIS — I10 Essential (primary) hypertension: Secondary | ICD-10-CM | POA: Insufficient documentation

## 2017-03-20 DIAGNOSIS — Z7982 Long term (current) use of aspirin: Secondary | ICD-10-CM | POA: Diagnosis not present

## 2017-03-20 DIAGNOSIS — E119 Type 2 diabetes mellitus without complications: Secondary | ICD-10-CM | POA: Diagnosis not present

## 2017-03-20 DIAGNOSIS — Z09 Encounter for follow-up examination after completed treatment for conditions other than malignant neoplasm: Secondary | ICD-10-CM | POA: Diagnosis not present

## 2017-03-20 DIAGNOSIS — Z8673 Personal history of transient ischemic attack (TIA), and cerebral infarction without residual deficits: Secondary | ICD-10-CM | POA: Diagnosis not present

## 2017-03-20 DIAGNOSIS — Z79899 Other long term (current) drug therapy: Secondary | ICD-10-CM | POA: Insufficient documentation

## 2017-03-20 DIAGNOSIS — Z794 Long term (current) use of insulin: Secondary | ICD-10-CM | POA: Insufficient documentation

## 2017-03-20 DIAGNOSIS — Z8249 Family history of ischemic heart disease and other diseases of the circulatory system: Secondary | ICD-10-CM | POA: Diagnosis not present

## 2017-03-20 HISTORY — PX: TEE WITHOUT CARDIOVERSION: SHX5443

## 2017-03-20 LAB — GLUCOSE, CAPILLARY: GLUCOSE-CAPILLARY: 145 mg/dL — AB (ref 65–99)

## 2017-03-20 SURGERY — ECHOCARDIOGRAM, TRANSESOPHAGEAL
Anesthesia: Moderate Sedation

## 2017-03-20 MED ORDER — DIPHENHYDRAMINE HCL 50 MG/ML IJ SOLN
INTRAMUSCULAR | Status: AC
  Filled 2017-03-20: qty 1

## 2017-03-20 MED ORDER — FENTANYL CITRATE (PF) 100 MCG/2ML IJ SOLN
INTRAMUSCULAR | Status: DC | PRN
  Administered 2017-03-20 (×3): 25 ug via INTRAVENOUS

## 2017-03-20 MED ORDER — BUTAMBEN-TETRACAINE-BENZOCAINE 2-2-14 % EX AERO
INHALATION_SPRAY | CUTANEOUS | Status: DC | PRN
  Administered 2017-03-20: 2 via TOPICAL

## 2017-03-20 MED ORDER — MIDAZOLAM HCL 10 MG/2ML IJ SOLN
INTRAMUSCULAR | Status: DC | PRN
  Administered 2017-03-20 (×3): 1 mg via INTRAVENOUS

## 2017-03-20 MED ORDER — SODIUM CHLORIDE 0.9 % IV SOLN
INTRAVENOUS | Status: DC
  Administered 2017-03-20: 08:00:00 via INTRAVENOUS

## 2017-03-20 MED ORDER — FENTANYL CITRATE (PF) 100 MCG/2ML IJ SOLN
INTRAMUSCULAR | Status: AC
  Filled 2017-03-20: qty 2

## 2017-03-20 MED ORDER — METOPROLOL TARTRATE 5 MG/5ML IV SOLN
INTRAVENOUS | Status: DC | PRN
  Administered 2017-03-20: 5 mg via INTRAVENOUS

## 2017-03-20 MED ORDER — MIDAZOLAM HCL 5 MG/ML IJ SOLN
INTRAMUSCULAR | Status: AC
  Filled 2017-03-20: qty 2

## 2017-03-20 NOTE — CV Procedure (Signed)
TEE: Under moderate sedation, TEE was performed without complications: LV: Normal. Normal EF. RV: Normal LA: Normal. Left atrial appendage: Normal without thrombus. Normal function. Inter atrial septum is intact without defect. Double contrast study negative for atrial level shunting. No late appearance of bubbles either. RA: Normal  MV: Normal Trace MR. TV: Normal Trace TR AV: Normal. No AI or AS. PV: Normal. Trace PI.  2-D, Doppler, and bubble study evaluation demonstrated no PFO  Thoracic and ascending aorta: Normal without significant plaque or atheromatous changes.  Conscious sedation protocol was followed, I personally administered conscious sedation and monitored the patient. Patient received 3 milligrams of Versed and 75 . Patient tolerated the procedure well and there was no complication from conscious sedation. Time administered was 30 and procedure ended at 08:37 AM.

## 2017-03-20 NOTE — Interval H&P Note (Signed)
History and Physical Interval Note:  03/20/2017 8:12 AM  Kelly Davies  has presented today for surgery, with the diagnosis of HEART FAILURE, HISTORY OF CVA  The various methods of treatment have been discussed with the patient and family. After consideration of risks, benefits and other options for treatment, the patient has consented to  Procedure(s): TRANSESOPHAGEAL ECHOCARDIOGRAM (TEE) (N/A) as a surgical intervention .  The patient's history has been reviewed, patient examined, no change in status, stable for surgery.  I have reviewed the patient's chart and labs.  Questions were answered to the patient's satisfaction.     [place]

## 2017-03-20 NOTE — Progress Notes (Signed)
  Echocardiogram Echocardiogram Transesophageal has been performed.  Kelly Davies 03/20/2017, 8:46 AM

## 2017-03-20 NOTE — Discharge Instructions (Signed)

## 2017-04-01 ENCOUNTER — Encounter: Payer: Self-pay | Admitting: Neurology

## 2017-04-01 ENCOUNTER — Ambulatory Visit: Payer: 59 | Admitting: Neurology

## 2017-04-01 VITALS — BP 135/88 | HR 105 | Ht 62.0 in | Wt 201.4 lb

## 2017-04-01 DIAGNOSIS — R400 Somnolence: Secondary | ICD-10-CM | POA: Diagnosis not present

## 2017-04-01 DIAGNOSIS — R0683 Snoring: Secondary | ICD-10-CM

## 2017-04-01 DIAGNOSIS — E669 Obesity, unspecified: Secondary | ICD-10-CM

## 2017-04-01 DIAGNOSIS — I639 Cerebral infarction, unspecified: Secondary | ICD-10-CM

## 2017-04-01 NOTE — Patient Instructions (Signed)
I had a long d/w patient about his recent stroke, risk for recurrent stroke/TIAs, personally independently reviewed imaging studies and stroke evaluation results and answered questions.Continue aspirin 325 mg daily  for secondary stroke prevention and maintain strict control of hypertension with blood pressure goal below 130/90, diabetes with hemoglobin A1c goal below 6.5% and lipids with LDL cholesterol goal below 70 mg/dL. I also advised the patient to eat a healthy diet with plenty of whole grains, cereals, fruits and vegetables, exercise regularly and maintain ideal body weight .check polysomnogram for sleep apnea. Consider possible participation in the [place] stroke prevention dental study if interested Followup in the future with my nurse practitioner Janett Billow in 3 months or call earlier if necessary Stroke Prevention Some medical conditions and behaviors are associated with an increased chance of having a stroke. You may prevent a stroke by making healthy choices and managing medical conditions. How can I reduce my risk of having a stroke?  Stay physically active. Get at least 30 minutes of activity on most or all days.  Do not smoke. It may also be helpful to avoid exposure to secondhand smoke.  Limit alcohol use. Moderate alcohol use is considered to be: ? No more than 2 drinks per day for men. ? No more than 1 drink per day for nonpregnant women.  Eat healthy foods. This involves: ? Eating 5 or more servings of fruits and vegetables a day. ? Making dietary changes that address high blood pressure (hypertension), high cholesterol, diabetes, or obesity.  Manage your cholesterol levels. ? Making food choices that are high in fiber and low in saturated fat, trans fat, and cholesterol may control cholesterol levels. ? Take any prescribed medicines to control cholesterol as directed by your health care provider.  Manage your diabetes. ? Controlling your carbohydrate and sugar intake is  recommended to manage diabetes. ? Take any prescribed medicines to control diabetes as directed by your health care provider.  Control your hypertension. ? Making food choices that are low in salt (sodium), saturated fat, trans fat, and cholesterol is recommended to manage hypertension. ? Ask your health care provider if you need treatment to lower your blood pressure. Take any prescribed medicines to control hypertension as directed by your health care provider. ? If you are 63-74 years of age, have your blood pressure checked every 3-5 years. If you are 51 years of age or older, have your blood pressure checked every year.  Maintain a healthy weight. ? Reducing calorie intake and making food choices that are low in sodium, saturated fat, trans fat, and cholesterol are recommended to manage weight.  Stop drug abuse.  Avoid taking birth control pills. ? Talk to your health care provider about the risks of taking birth control pills if you are over 61 years old, smoke, get migraines, or have ever had a blood clot.  Get evaluated for sleep disorders (sleep apnea). ? Talk to your health care provider about getting a sleep evaluation if you snore a lot or have excessive sleepiness.  Take medicines only as directed by your health care provider. ? For some people, aspirin or blood thinners (anticoagulants) are helpful in reducing the risk of forming abnormal blood clots that can lead to stroke. If you have the irregular heart rhythm of atrial fibrillation, you should be on a blood thinner unless there is a good reason you cannot take them. ? Understand all your medicine instructions.  Make sure that other conditions (such as anemia or atherosclerosis) are  addressed. Get help right away if:  You have sudden weakness or numbness of the face, arm, or leg, especially on one side of the body.  Your face or eyelid droops to one side.  You have sudden confusion.  You have trouble speaking  (aphasia) or understanding.  You have sudden trouble seeing in one or both eyes.  You have sudden trouble walking.  You have dizziness.  You have a loss of balance or coordination.  You have a sudden, severe headache with no known cause.  You have new chest pain or an irregular heartbeat. Any of these symptoms may represent a serious problem that is an emergency. Do not wait to see if the symptoms will go away. Get medical help at once. Call your local emergency services (911 in [place] Do not drive yourself to the hospital. This information is not intended to replace advice given to you by your health care provider. Make sure you discuss any questions you have with your health care provider. Document Released: 05/09/2004 Document Revised: 09/07/2015 Document Reviewed: 10/02/2012 Elsevier Interactive Patient Education  2017 [employer].

## 2017-04-01 NOTE — Progress Notes (Signed)
[facility] [address] [place]. [place] [address] [phone]       OFFICE FOLLOW-UP NOTE  Ms. Kelly Davies Date of Birth:  [birth year] Medical Record Number:  [id number]   HPI: Ms Kelly Davies is a 46 year pleasant African-American lady seen today for the first office follow-up visit following hospital admission for strokes and seizures in October 2018.  History is obtained from the patient and personal review of electronic medical records.  I have also personally reviewed imaging films and stroke workup.Kelly Tigue Mooreis a 46 y.o.femalewho was in her normal state of health until around 10:30  On 01/14/2017 when she began having numbness and tingling in her left hand. She states that started in her hand and moved upwards and then she began having jerking movements of her arm. She describes her entire arm jerking. She was not sure if her face or leg were normal. The jerking lasted for approximately 1 minute and for about 20-30 minutes following that she had left-sided weakness. Due to the left-sided weakness, EMS activated code stroke, but she was resolved by the time of arrival to the emergency department with the exception of very mild tingling in her fingertips on the left. LKW: 10:30 PM on 01/14/2017.tpa given?: no, resolution of symptoms CT scan of the head on admission.  Showed no abnormalities.  CT angiogram showed no significant large vessel extracranial or intracranial stenosis.  MR scan of the brain showed multiple tiny acute right frontoparietal infarcts.  Transthoracic echo showed normal ejection fraction.  Transesophageal echocardiogram showed no cardiac source of embolism or PFO.  LDL cholesterol 110 mg percent.  Hemoglobin A1c was significantly elevated at 10.5%.  Patient was started on aspirin and Lipitor as well as Keppra for seizures.  She had  outpatient 30-day cardiac monitoring done by her cardiologist Dr. Virgina Jock which was negative for any atrial fibrillation.   She states she is tolerating aspirin well without bruising or bleeding.  She has had no recurrent stroke or TIA symptoms.  Her blood pressure is well controlled and today it is 135/88.  Her fasting sugars have also been quite good in the 90s only.  She is tolerating Lipitor well without muscle aches and pains.  She is eating healthy and has lost 10 pounds.  She plans to be more active.  She does admit to snoring as well as having daytime sleepiness and napping easily.  She has not been evaluated for sleep apnea.   ROS:   14 system review of systems is positive for snoring, daytime sleepiness and all other systems negative PMH:  Past Medical History:  Diagnosis Date  . Diabetes mellitus without complication ([place])   . Hypercholesteremia   . Hypertension   . Partial seizure ([place])   . Stroke ([place])   . TIA (transient ischemic attack)     Social History:  Social History   Socioeconomic History  . Marital status: Single    Spouse name: Not on file  . Number of children: Not on file  . Years of education: Not on file  . Highest education level: Not on file  Social Needs  . Financial resource strain: Not on file  . Food insecurity - worry: Not on file  . Food insecurity - inability: Not on file  . Transportation needs - medical: Not on file  . Transportation needs - non-medical: Not on file  Occupational History  . Not on file  Tobacco Use  . Smoking status: Never Smoker  .  Smokeless tobacco: Never Used  Substance and Sexual Activity  . Alcohol use: No  . Drug use: No  . Sexual activity: No  Other Topics Concern  . Not on file  Social History Narrative  . Not on file    Medications:   Current Outpatient Medications on File Prior to Visit  Medication Sig Dispense Refill  . amLODipine (NORVASC) 5 MG tablet Take 1 tablet (5 mg total) by mouth daily. (Patient taking differently: Take 2.5 mg by mouth daily. ) 30 tablet 0  . aspirin 325 MG tablet Take 1 tablet (325 mg total) by  mouth daily. 30 tablet 0  . atorvastatin (LIPITOR) 40 MG tablet Take 1 tablet (40 mg total) by mouth daily at 6 PM. 30 tablet 0  . cetirizine (ZYRTEC) 10 MG tablet Take 10 mg daily as needed by mouth for allergies.    Marland Kitchen levETIRAcetam (KEPPRA) 500 MG tablet Take 1 tablet (500 mg total) by mouth 2 (two) times daily. 60 tablet 0  . Olopatadine HCl (PAZEO) 0.7 % SOLN Apply 1 drop daily to eye.    . perindopril (ACEON) 8 MG tablet Take 4 mg by mouth at bedtime.    Vladimir Faster Glycol-Propyl Glycol (SYSTANE OP) Apply 1 drop daily as needed to eye (dry eyes).    . sitaGLIPtin-metformin (JANUMET) 50-500 MG tablet Take 1 tablet by mouth 2 (two) times daily.     No current facility-administered medications on file prior to visit.     Allergies:   Allergies  Allergen Reactions  . Latex Hives    Physical Exam General: Mildly obese middle-aged African-American lady seated, in no evident distress Head: head normocephalic and atraumatic.  Neck: supple with no carotid or supraclavicular bruits Cardiovascular: regular rate and rhythm, no murmurs Musculoskeletal: no deformity Skin:  no rash/petichiae Vascular:  Normal pulses all extremities Vitals:   04/01/17 1051  BP: 135/88  Pulse: (!) 105   Neurologic Exam Mental Status: Awake and fully alert. Oriented to place and time. Recent and remote memory intact. Attention span, concentration and fund of knowledge appropriate. Mood and affect appropriate.  Cranial Nerves: Fundoscopic exam reveals sharp disc margins. Pupils equal, briskly reactive to light. Extraocular movements full without nystagmus. Visual fields full to confrontation. Hearing intact. Facial sensation intact. Face, tongue, palate moves normally and symmetrically.  Motor: Normal bulk and tone. Normal strength in all tested extremity muscles.  Diminished fine finger movements on the left.  Orbits right over left upper extremity.  Minimum left grip weakness. Sensory.: intact to touch  ,pinprick .position and vibratory sensation.  Coordination: Rapid alternating movements normal in all extremities. Finger-to-nose and heel-to-shin performed accurately bilaterally. Gait and Station: Arises from chair without difficulty. Stance is normal. Gait demonstrates normal stride length and balance . Able to heel, toe and tandem walk without difficulty.  Reflexes: 1+ and symmetric. Toes downgoing.   NIHSS  0 Modified Rankin  1   ASSESSMENT: 46 year old lady with multiple tiny right frontoparietal MCA branch infarcts of cryptogenic etiology in October 2018 with simple partial symptomatic seizures.  Vascular risk factors of uncontrolled hypertension, diabetes,hyperlipidemia, mild obesity and suspected sleep apnea    PLAN: I had a long d/w patient about his recent stroke, risk for recurrent stroke/TIAs, personally independently reviewed imaging studies and stroke evaluation results and answered questions.Continue aspirin 325 mg daily  for secondary stroke prevention and maintain strict control of hypertension with blood pressure goal below 130/90, diabetes with hemoglobin A1c goal below 6.5% and lipids with LDL  cholesterol goal below 70 mg/dL. I also advised the patient to eat a healthy diet with plenty of whole grains, cereals, fruits and vegetables, exercise regularly and maintain ideal body weight .check polysomnogram for sleep apnea. Consider possible participation in the [place] stroke prevention dental study if interested Followup in the future with my nurse practitioner Janett Billow in 3 months or call earlier if necessary Greater than 50% of time during this 25 minute visit was spent on counseling,explanation of diagnosis of cryptogenic stroke , planning of further management, discussion with patient and family and coordination of care Antony Contras, MD  [facility] [address] [place], [place] [address]  Phone [phone] Fax [phone] Note: This  document was prepared with digital dictation and possible smart phrase technology. Any transcriptional errors that result from this process are unintentional

## 2017-04-02 ENCOUNTER — Telehealth: Payer: Self-pay | Admitting: Neurology

## 2017-04-02 NOTE — Telephone Encounter (Signed)
Rn call patient that on her avs Dr. Leonie Man wants her to continue 325 aspirin. Pt stated her PCP change it to 81mg . Rn stated Dr.Sethi is her stroke MD, and her wants her to continue 325. Rn ask pt to always look at her AVS after her office notes. Pt did have a copy of her avs from [place] visit and saw the instructions. PT verbalized understanding.

## 2017-04-02 NOTE — Telephone Encounter (Signed)
Pt calling PCP sent in aspirin 81 mg to mail order, Dr Leonie Man prescribed aspirin 325mg . Pt is needing clarification as to what mg is correct for her to be taking. Please call to advise

## 2017-04-23 DIAGNOSIS — D649 Anemia, unspecified: Secondary | ICD-10-CM | POA: Diagnosis not present

## 2017-04-23 DIAGNOSIS — I5030 Unspecified diastolic (congestive) heart failure: Secondary | ICD-10-CM | POA: Diagnosis not present

## 2017-04-23 DIAGNOSIS — Z8673 Personal history of transient ischemic attack (TIA), and cerebral infarction without residual deficits: Secondary | ICD-10-CM | POA: Diagnosis not present

## 2017-04-23 DIAGNOSIS — R0602 Shortness of breath: Secondary | ICD-10-CM | POA: Diagnosis not present

## 2017-04-23 DIAGNOSIS — I10 Essential (primary) hypertension: Secondary | ICD-10-CM | POA: Diagnosis not present

## 2017-04-23 DIAGNOSIS — E119 Type 2 diabetes mellitus without complications: Secondary | ICD-10-CM | POA: Diagnosis not present

## 2017-04-23 DIAGNOSIS — E782 Mixed hyperlipidemia: Secondary | ICD-10-CM | POA: Diagnosis not present

## 2017-04-24 DIAGNOSIS — D649 Anemia, unspecified: Secondary | ICD-10-CM | POA: Diagnosis not present

## 2017-04-25 DIAGNOSIS — E119 Type 2 diabetes mellitus without complications: Secondary | ICD-10-CM | POA: Diagnosis not present

## 2017-04-25 DIAGNOSIS — D509 Iron deficiency anemia, unspecified: Secondary | ICD-10-CM | POA: Diagnosis not present

## 2017-04-25 DIAGNOSIS — R195 Other fecal abnormalities: Secondary | ICD-10-CM | POA: Diagnosis not present

## 2017-04-25 DIAGNOSIS — I5030 Unspecified diastolic (congestive) heart failure: Secondary | ICD-10-CM | POA: Diagnosis not present

## 2017-04-25 DIAGNOSIS — D649 Anemia, unspecified: Secondary | ICD-10-CM | POA: Diagnosis not present

## 2017-04-25 DIAGNOSIS — D259 Leiomyoma of uterus, unspecified: Secondary | ICD-10-CM | POA: Diagnosis not present

## 2017-04-25 DIAGNOSIS — K921 Melena: Secondary | ICD-10-CM | POA: Diagnosis not present

## 2017-04-25 DIAGNOSIS — R0602 Shortness of breath: Secondary | ICD-10-CM | POA: Diagnosis not present

## 2017-04-25 DIAGNOSIS — Z7982 Long term (current) use of aspirin: Secondary | ICD-10-CM | POA: Diagnosis not present

## 2017-04-26 DIAGNOSIS — D649 Anemia, unspecified: Secondary | ICD-10-CM | POA: Diagnosis not present

## 2017-04-26 DIAGNOSIS — K921 Melena: Secondary | ICD-10-CM | POA: Diagnosis not present

## 2017-04-26 DIAGNOSIS — D509 Iron deficiency anemia, unspecified: Secondary | ICD-10-CM | POA: Diagnosis not present

## 2017-04-30 DIAGNOSIS — D509 Iron deficiency anemia, unspecified: Secondary | ICD-10-CM | POA: Diagnosis not present

## 2017-04-30 DIAGNOSIS — R5383 Other fatigue: Secondary | ICD-10-CM | POA: Diagnosis not present

## 2017-05-05 ENCOUNTER — Telehealth: Payer: Self-pay | Admitting: Adult Health

## 2017-05-05 NOTE — Telephone Encounter (Signed)
1/21-LVM for pt to call and schedule a 3 month f/u with NP JV per Dr. Leonie Man. Needs to schedule around 06/30/17  JBA

## 2017-05-07 DIAGNOSIS — D509 Iron deficiency anemia, unspecified: Secondary | ICD-10-CM | POA: Diagnosis not present

## 2017-05-08 DIAGNOSIS — D509 Iron deficiency anemia, unspecified: Secondary | ICD-10-CM | POA: Diagnosis not present

## 2017-05-12 DIAGNOSIS — K3189 Other diseases of stomach and duodenum: Secondary | ICD-10-CM | POA: Diagnosis not present

## 2017-05-12 DIAGNOSIS — K2950 Unspecified chronic gastritis without bleeding: Secondary | ICD-10-CM | POA: Diagnosis not present

## 2017-05-12 DIAGNOSIS — K259 Gastric ulcer, unspecified as acute or chronic, without hemorrhage or perforation: Secondary | ICD-10-CM | POA: Diagnosis not present

## 2017-05-12 DIAGNOSIS — K2980 Duodenitis without bleeding: Secondary | ICD-10-CM | POA: Diagnosis not present

## 2017-05-12 DIAGNOSIS — Z1211 Encounter for screening for malignant neoplasm of colon: Secondary | ICD-10-CM | POA: Diagnosis not present

## 2017-05-12 DIAGNOSIS — K2981 Duodenitis with bleeding: Secondary | ICD-10-CM | POA: Diagnosis not present

## 2017-05-12 DIAGNOSIS — K2930 Chronic superficial gastritis without bleeding: Secondary | ICD-10-CM | POA: Diagnosis not present

## 2017-05-12 DIAGNOSIS — K5730 Diverticulosis of large intestine without perforation or abscess without bleeding: Secondary | ICD-10-CM | POA: Diagnosis not present

## 2017-05-12 DIAGNOSIS — K648 Other hemorrhoids: Secondary | ICD-10-CM | POA: Diagnosis not present

## 2017-05-15 DIAGNOSIS — D509 Iron deficiency anemia, unspecified: Secondary | ICD-10-CM | POA: Diagnosis not present

## 2017-05-15 DIAGNOSIS — Z8673 Personal history of transient ischemic attack (TIA), and cerebral infarction without residual deficits: Secondary | ICD-10-CM | POA: Diagnosis not present

## 2017-05-15 DIAGNOSIS — E119 Type 2 diabetes mellitus without complications: Secondary | ICD-10-CM | POA: Diagnosis not present

## 2017-05-22 ENCOUNTER — Telehealth: Payer: Self-pay | Admitting: Adult Health

## 2017-05-22 NOTE — Telephone Encounter (Signed)
LVM for pt to schedule a 3 month f/u per Dr. Leonie Man w/NP JV. This will be for the date of 06/30/17

## 2017-06-05 DIAGNOSIS — D500 Iron deficiency anemia secondary to blood loss (chronic): Secondary | ICD-10-CM | POA: Diagnosis not present

## 2017-06-05 DIAGNOSIS — D509 Iron deficiency anemia, unspecified: Secondary | ICD-10-CM | POA: Diagnosis not present

## 2017-06-11 NOTE — Telephone Encounter (Signed)
Pt returned the call, appt scheduled for 06/30/17

## 2017-06-30 ENCOUNTER — Encounter: Payer: Self-pay | Admitting: Adult Health

## 2017-06-30 ENCOUNTER — Ambulatory Visit: Payer: 59 | Admitting: Adult Health

## 2017-06-30 VITALS — BP 154/99 | HR 86 | Wt 204.6 lb

## 2017-06-30 DIAGNOSIS — I639 Cerebral infarction, unspecified: Secondary | ICD-10-CM | POA: Diagnosis not present

## 2017-06-30 DIAGNOSIS — E785 Hyperlipidemia, unspecified: Secondary | ICD-10-CM

## 2017-06-30 DIAGNOSIS — E119 Type 2 diabetes mellitus without complications: Secondary | ICD-10-CM | POA: Diagnosis not present

## 2017-06-30 DIAGNOSIS — I10 Essential (primary) hypertension: Secondary | ICD-10-CM | POA: Diagnosis not present

## 2017-06-30 DIAGNOSIS — G40209 Localization-related (focal) (partial) symptomatic epilepsy and epileptic syndromes with complex partial seizures, not intractable, without status epilepticus: Secondary | ICD-10-CM | POA: Diagnosis not present

## 2017-06-30 MED ORDER — LEVETIRACETAM 500 MG PO TABS: 500.0000 mg | ORAL_TABLET | Freq: Two times a day (BID) | ORAL | 7 refills | Status: DC

## 2017-06-30 NOTE — Progress Notes (Addendum)
[facility] [address] [place]. [place] [address] [phone]       OFFICE FOLLOW-UP NOTE  Kelly. Kelly Davies Date of Birth:  April 17, 1970 Medical Record Number:  [id number]   HPI: Kelly Davies is a 67 year pleasant African-American lady seen today for the first office follow-up visit following hospital admission for strokes and seizures in October 2018.  History is obtained from the patient and personal review of electronic medical records.  I have also personally reviewed imaging films and stroke workup.Kelly Epperly Mooreis a 47 y.o.femalewho was in her normal state of health until around 10:30  On 01/14/2017 when she began having numbness and tingling in her left hand. She states that started in her hand and moved upwards and then she began having jerking movements of her arm. She describes her entire arm jerking. She was not sure if her face or leg were normal. The jerking lasted for approximately 1 minute and for about 20-30 minutes following that she had left-sided weakness. Due to the left-sided weakness, EMS activated code stroke, but she was resolved by the time of arrival to the emergency department with the exception of very mild tingling in her fingertips on the left. LKW: 10:30 PM on 01/14/2017.tpa given?: no, resolution of symptoms CT scan of the head on admission.  Showed no abnormalities.  CT angiogram showed no significant large vessel extracranial or intracranial stenosis.  MR scan of the brain showed multiple tiny acute right frontoparietal infarcts.  Transthoracic echo showed normal ejection fraction.  Transesophageal echocardiogram showed no cardiac source of embolism or PFO.  LDL cholesterol 110 mg percent.  Hemoglobin A1c was significantly elevated at 10.5%.  Patient was started on aspirin and Lipitor as well as Keppra for seizures.  She had  outpatient 30-day cardiac monitoring done by her cardiologist Dr. Virgina Jock which was negative for any atrial fibrillation.   She states she is tolerating aspirin well without bruising or bleeding.  She has had no recurrent stroke or TIA symptoms.  Her blood pressure is well controlled and today it is 135/88.  Her fasting sugars have also been quite good in the 90s only.  She is tolerating Lipitor well without muscle aches and pains.  She is eating healthy and has lost 10 pounds.  She plans to be more active.  She does admit to snoring as well as having daytime sleepiness and napping easily.  She has not been evaluated for sleep apnea.  UPDATE 06/30/17: Patient is being seen today for six-month follow-up.  Overall has been doing well without new or worsening stroke/TIA symptoms or seizure activity.  Continues to take aspirin without increase in bleeding or bruising.  Continues to take Lipitor without myalgias.  Continues to take heparin which she is tolerating well and denies seizure activity.  Did undergo a 30-day cardiac monitor which was negative for atrial fibrillation.  Sleep apnea referral was placed at last visit but per patient, she was hospitalized in January 2019 with a diagnosis of anemia which she received RBC transfusions and iron infusions.  Since this, she has been feeling much better without constant fatigue.  She would like to hold off on sleep apnea testing at this time.  Continues to work in [place] at [employer] where she does drive back and forth daily.  Recent LDL 68 and A1c 6.2 drawn on 04/23/2017.  Blood pressure today's is elevated at 154/99.  States she does check blood pressure at home with SBP 130s.  Has been  eating healthier and exercising when able.  No new concerns or issues at today's visit.  ROS:   14 system review of systems is positive for environmental allergies and all other systems negative  PMH:  Past Medical History:  Diagnosis Date  . Diabetes mellitus without complication ([place])   . Hypercholesteremia   . Hypertension   . Partial seizure ([place])   . Stroke ([place])   . TIA (transient  ischemic attack)     Social History:  Social History   Socioeconomic History  . Marital status: Single    Spouse name: Not on file  . Number of children: Not on file  . Years of education: Not on file  . Highest education level: Not on file  Social Needs  . Financial resource strain: Not on file  . Food insecurity - worry: Not on file  . Food insecurity - inability: Not on file  . Transportation needs - medical: Not on file  . Transportation needs - non-medical: Not on file  Occupational History  . Not on file  Tobacco Use  . Smoking status: Never Smoker  . Smokeless tobacco: Never Used  Substance and Sexual Activity  . Alcohol use: No  . Drug use: No  . Sexual activity: No  Other Topics Concern  . Not on file  Social History Narrative  . Not on file    Medications:   Current Outpatient Medications on File Prior to Visit  Medication Sig Dispense Refill  . aspirin 325 MG tablet Take 1 tablet (325 mg total) by mouth daily. 30 tablet 0  . atorvastatin (LIPITOR) 40 MG tablet Take 1 tablet (40 mg total) by mouth daily at 6 PM. 30 tablet 0  . cetirizine (ZYRTEC) 10 MG tablet Take 10 mg daily as needed by mouth for allergies.    . folic acid (FOLVITE) 1 MG tablet Take by mouth.    . Iron-FA-B Cmp-C-Biot-Probiotic (FUSION PLUS PO) Take by mouth.    Marland Kitchen LACTOBACILLUS PO Take by mouth.    . Olopatadine HCl (PAZEO) 0.7 % SOLN Apply 1 drop daily to eye.    . pantoprazole (PROTONIX) 40 MG tablet   1  . perindopril (ACEON) 8 MG tablet Take 4 mg by mouth at bedtime.    Kelly Davies Glycol-Propyl Glycol (SYSTANE OP) Apply 1 drop daily as needed to eye (dry eyes).    . sitaGLIPtin-metformin (JANUMET) 50-500 MG tablet Take 1 tablet by mouth 2 (two) times daily.     No current facility-administered medications on file prior to visit.     Allergies:   Allergies  Allergen Reactions  . Latex Hives    Physical Exam General: Mildly obese middle-aged African-American lady seated, in no  evident distress Head: head normocephalic and atraumatic.  Neck: supple with no carotid or supraclavicular bruits Cardiovascular: regular rate and rhythm, no murmurs Musculoskeletal: no deformity Skin:  no rash/petichiae Vascular:  Normal pulses all extremities Vitals:   06/30/17 1118  BP: (!) 154/99  Pulse: 86   Neurologic Exam Mental Status: Awake and fully alert. Oriented to place and time. Recent and remote memory intact. Attention span, concentration and fund of knowledge appropriate. Mood and affect appropriate.  Cranial Nerves: Fundoscopic exam reveals sharp disc margins. Pupils equal, briskly reactive to light. Extraocular movements full without nystagmus. Visual fields full to confrontation. Hearing intact. Facial sensation intact. Face, tongue, palate moves normally and symmetrically.  Motor: Normal bulk and tone. Normal strength in all tested extremity muscles. Sensory.: intact to  touch ,pinprick .position and vibratory sensation.  Coordination: Rapid alternating movements normal in all extremities. Finger-to-nose and heel-to-shin performed accurately bilaterally. Gait and Station: Arises from chair without difficulty. Stance is normal. Gait demonstrates normal stride length and balance . Able to heel, toe and tandem walk without difficulty.  Reflexes: 1+ and symmetric. Toes downgoing.    ASSESSMENT: 47 year old lady with multiple tiny right frontoparietal MCA branch infarcts of cryptogenic etiology in October 2018 with simple partial symptomatic seizures.  Vascular risk factors of uncontrolled hypertension, diabetes,hyperlipidemia, mild obesity and anemia.  Patient doing well since previous appointment with a new diagnosis of anemia denies new or worsening stroke/TIA symptoms.   PLAN: -Continue aspirin 325 mg daily  and lipitor  for secondary stroke prevention -Continue to take keppra 500mg  twice a day at this time to prevent seizure activity -spoke to Dr. Erlinda Hong regarding  continuation of this medication.  As patient does drive daily to [facility] for work and patient tolerating Keppra without side effects, we will continue Keppra 500 mg twice daily for seizure prophylaxis.  Patient in agreement to this plan.  We will consider tapering off Keppra as long as seizure-free for at least 2 years and tolerance to this medication continues. -Follow up with your PCP regarding your cholesterol, blood pressure and diabetes.  Maintain strict control of hypertension with blood pressure goal below 130/90, diabetes with hemoglobin A1c goal below 6.5% and cholesterol with LDL cholesterol (bad cholesterol) goal below 70 mg/dL. I also advised the patient to eat a healthy diet with plenty of whole grains, cereals, fruits and vegetables, exercise regularly and maintain ideal body weight.  Followup in the future with me in 6 months or call earlier if needed  Greater than 50% time during this 25 minute consultation visit was spent on counseling and coordination of care about HTN, HLD, and  DM (risk factors), discussion about risk benefit of anticoagulation and answering questions.    Venancio Poisson, AGNP-BC  [facility] [address] [place], [place] [address]  Phone [phone] Fax [phone]  I reviewed the above note and documentation by the Nurse Practitioner and agree with the history, physical exam, assessment and plan as outlined above. I was immediately available for face-to-face consultation. Star Age, MD, PhD [facility])

## 2017-06-30 NOTE — Patient Instructions (Signed)
Continue aspirin 325 mg daily  and lipitor  for secondary stroke prevention  Continue to take keppra 500mg  twice a day at this time to prevent seizure activity  Follow up with your PCP regarding your cholesterol, blood pressure and diabetes.   Maintain strict control of hypertension with blood pressure goal below 130/90, diabetes with hemoglobin A1c goal below 6.5% and cholesterol with LDL cholesterol (bad cholesterol) goal below 70 mg/dL. I also advised the patient to eat a healthy diet with plenty of whole grains, cereals, fruits and vegetables, exercise regularly and maintain ideal body weight.  Followup in the future with me in 6 months or call earlier if needed

## 2017-07-17 DIAGNOSIS — D500 Iron deficiency anemia secondary to blood loss (chronic): Secondary | ICD-10-CM | POA: Diagnosis not present

## 2017-07-24 DIAGNOSIS — D509 Iron deficiency anemia, unspecified: Secondary | ICD-10-CM | POA: Diagnosis not present

## 2017-07-31 DIAGNOSIS — D509 Iron deficiency anemia, unspecified: Secondary | ICD-10-CM | POA: Diagnosis not present

## 2017-08-14 DIAGNOSIS — E119 Type 2 diabetes mellitus without complications: Secondary | ICD-10-CM | POA: Diagnosis not present

## 2017-08-14 DIAGNOSIS — E782 Mixed hyperlipidemia: Secondary | ICD-10-CM | POA: Diagnosis not present

## 2017-08-14 DIAGNOSIS — I10 Essential (primary) hypertension: Secondary | ICD-10-CM | POA: Diagnosis not present

## 2017-10-01 DIAGNOSIS — D500 Iron deficiency anemia secondary to blood loss (chronic): Secondary | ICD-10-CM | POA: Diagnosis not present

## 2017-10-01 DIAGNOSIS — D509 Iron deficiency anemia, unspecified: Secondary | ICD-10-CM | POA: Diagnosis not present

## 2017-10-28 DIAGNOSIS — Z23 Encounter for immunization: Secondary | ICD-10-CM | POA: Diagnosis not present

## 2017-10-28 DIAGNOSIS — E118 Type 2 diabetes mellitus with unspecified complications: Secondary | ICD-10-CM | POA: Diagnosis not present

## 2017-10-28 DIAGNOSIS — Z6837 Body mass index (BMI) 37.0-37.9, adult: Secondary | ICD-10-CM | POA: Diagnosis not present

## 2017-10-28 DIAGNOSIS — I10 Essential (primary) hypertension: Secondary | ICD-10-CM | POA: Diagnosis not present

## 2017-10-28 DIAGNOSIS — D500 Iron deficiency anemia secondary to blood loss (chronic): Secondary | ICD-10-CM | POA: Diagnosis not present

## 2017-10-30 DIAGNOSIS — D500 Iron deficiency anemia secondary to blood loss (chronic): Secondary | ICD-10-CM | POA: Diagnosis not present

## 2017-11-06 DIAGNOSIS — D500 Iron deficiency anemia secondary to blood loss (chronic): Secondary | ICD-10-CM | POA: Diagnosis not present

## 2017-11-18 DIAGNOSIS — Z7984 Long term (current) use of oral hypoglycemic drugs: Secondary | ICD-10-CM | POA: Diagnosis not present

## 2017-11-18 DIAGNOSIS — E119 Type 2 diabetes mellitus without complications: Secondary | ICD-10-CM | POA: Diagnosis not present

## 2017-11-18 DIAGNOSIS — I10 Essential (primary) hypertension: Secondary | ICD-10-CM | POA: Diagnosis not present

## 2017-11-18 DIAGNOSIS — E785 Hyperlipidemia, unspecified: Secondary | ICD-10-CM | POA: Diagnosis not present

## 2017-11-27 DIAGNOSIS — D500 Iron deficiency anemia secondary to blood loss (chronic): Secondary | ICD-10-CM | POA: Diagnosis not present

## 2017-11-27 DIAGNOSIS — D251 Intramural leiomyoma of uterus: Secondary | ICD-10-CM | POA: Diagnosis not present

## 2017-11-27 DIAGNOSIS — N920 Excessive and frequent menstruation with regular cycle: Secondary | ICD-10-CM | POA: Diagnosis not present

## 2017-12-04 DIAGNOSIS — D649 Anemia, unspecified: Secondary | ICD-10-CM | POA: Diagnosis not present

## 2017-12-04 DIAGNOSIS — I10 Essential (primary) hypertension: Secondary | ICD-10-CM | POA: Diagnosis not present

## 2017-12-04 DIAGNOSIS — E118 Type 2 diabetes mellitus with unspecified complications: Secondary | ICD-10-CM | POA: Diagnosis not present

## 2017-12-04 DIAGNOSIS — E782 Mixed hyperlipidemia: Secondary | ICD-10-CM | POA: Diagnosis not present

## 2017-12-09 DIAGNOSIS — Z23 Encounter for immunization: Secondary | ICD-10-CM | POA: Diagnosis not present

## 2017-12-09 DIAGNOSIS — I10 Essential (primary) hypertension: Secondary | ICD-10-CM | POA: Diagnosis not present

## 2017-12-09 DIAGNOSIS — Z6839 Body mass index (BMI) 39.0-39.9, adult: Secondary | ICD-10-CM | POA: Diagnosis not present

## 2017-12-09 DIAGNOSIS — E782 Mixed hyperlipidemia: Secondary | ICD-10-CM | POA: Diagnosis not present

## 2017-12-31 ENCOUNTER — Ambulatory Visit: Payer: 59 | Admitting: Adult Health

## 2017-12-31 ENCOUNTER — Encounter: Payer: Self-pay | Admitting: Adult Health

## 2017-12-31 VITALS — BP 128/90 | HR 76 | Wt 209.4 lb

## 2017-12-31 DIAGNOSIS — I10 Essential (primary) hypertension: Secondary | ICD-10-CM | POA: Diagnosis not present

## 2017-12-31 DIAGNOSIS — E119 Type 2 diabetes mellitus without complications: Secondary | ICD-10-CM

## 2017-12-31 DIAGNOSIS — G40209 Localization-related (focal) (partial) symptomatic epilepsy and epileptic syndromes with complex partial seizures, not intractable, without status epilepticus: Secondary | ICD-10-CM

## 2017-12-31 DIAGNOSIS — I639 Cerebral infarction, unspecified: Secondary | ICD-10-CM | POA: Diagnosis not present

## 2017-12-31 DIAGNOSIS — E785 Hyperlipidemia, unspecified: Secondary | ICD-10-CM | POA: Diagnosis not present

## 2017-12-31 NOTE — Progress Notes (Signed)
[facility] [address] [place]. [place] [address] [phone]       OFFICE FOLLOW-UP NOTE  Kelly. Kelly Davies Date of Birth:  24-Dec-1970 Medical Record Number:  [id number]   HPI: Kelly Davies is a 34 year pleasant African-American lady seen today in the office for follow-up visit following hospital admission for strokes and seizures in October 2018.  History is obtained from the patient and personal review of electronic medical records.  I have also personally reviewed imaging films and stroke workup. Kelly Davies a 47 y.o.femalewho was in her normal state of health until around 10:30  On 01/14/2017 when she began having numbness and tingling in her left hand. She states that started in her hand and moved upwards and then she began having jerking movements of her arm. She describes her entire arm jerking. She was not sure if her face or leg were normal. The jerking lasted for approximately 1 minute and for about 20-30 minutes following that she had left-sided weakness. Due to the left-sided weakness, EMS activated code stroke, but she was resolved by the time of arrival to the emergency department with the exception of very mild tingling in her fingertips on the left. LKW: 10:30 PM on 01/14/2017.tpa given?: no, resolution of symptoms CT scan of the head on admission.  Showed no abnormalities.  CT angiogram showed no significant large vessel extracranial or intracranial stenosis.  MR scan of the brain showed multiple tiny acute right frontoparietal infarcts.  Transthoracic echo showed normal ejection fraction.  Transesophageal echocardiogram showed no cardiac source of embolism or PFO.  LDL cholesterol 110 mg percent.  Hemoglobin A1c was significantly elevated at 10.5%.  Patient was started on aspirin and Lipitor as well as Keppra for seizures.  She had  outpatient 30-day cardiac monitoring done by her cardiologist Dr. Virgina Jock which was negative for any atrial fibrillation.   She states she is tolerating aspirin well without bruising or bleeding.  She has had no recurrent stroke or TIA symptoms.  Her blood pressure is well controlled and today it is 135/88.  Her fasting sugars have also been quite good in the 90s only.  She is tolerating Lipitor well without muscle aches and pains.  She is eating healthy and has lost 10 pounds.  She plans to be more active.  She does admit to snoring as well as having daytime sleepiness and napping easily.  She has not been evaluated for sleep apnea.   06/30/17 visit: Patient is being seen today for six-month follow-up.  Overall has been doing well without new or worsening stroke/TIA symptoms or seizure activity.  Continues to take aspirin without increase in bleeding or bruising.  Continues to take Lipitor without myalgias.  Continues to take heparin which she is tolerating well and denies seizure activity.  Did undergo a 30-day cardiac monitor which was negative for atrial fibrillation.  Sleep apnea referral was placed at last visit but per patient, she was hospitalized in January 2019 with a diagnosis of anemia which she received RBC transfusions and iron infusions.  Since this, she has been feeling much better without constant fatigue.  She would like to hold off on sleep apnea testing at this time.  Continues to work in [place] at [employer] where she does drive back and forth daily.  Recent LDL 68 and A1c 6.2 drawn on 04/23/2017.  Blood pressure today's is elevated at 154/99.  States she does check blood pressure at home with SBP 130s.  Has been eating healthier and exercising when able.  No new concerns or issues at today's visit.  Interval history 12/31/2017: Patient is being seen for scheduled follow-up visit.  She states overall she is been doing well and denies recent stroke/TIA symptoms or seizure activity.  She continues to take Keppra 500 mg twice daily.  Continues to take aspirin without side effects of bleeding or bruising.  Continues to  take Lipitor without side effects myalgias.  Blood pressure today 128/90.  Does monitor glucose levels at home with most recent A1c 5.4.  She continues to receive iron infusions for anemia.  Recent follow-up with OB/GYN and questioning whether she can start Lupron injections as patient does need a hysterectomy eventually but the goal of Lupron injection is to shrink uterus so that she can undergo laparoscopic hysterectomy.  Patient states that she was told she will need these injections for approximately 6 months but was advised to speak to [place] regarding his injections for possible contraindications due to stroke and seizures.  Denies new or worsening stroke/TIA symptoms or seizure activity.    ROS:   14 system review of systems is positive for eye itching, eye redness, runny nose, anemia, headache and environmental allergies and all other systems negative  PMH:  Past Medical History:  Diagnosis Date  . Diabetes mellitus without complication ([place])   . Hypercholesteremia   . Hypertension   . Partial seizure ([place])   . Stroke ([place])   . TIA (transient ischemic attack)     Social History:  Social History   Socioeconomic History  . Marital status: Single    Spouse name: Not on file  . Number of children: Not on file  . Years of education: Not on file  . Highest education level: Not on file  Occupational History  . Not on file  Social Needs  . Financial resource strain: Not on file  . Food insecurity:    Worry: Not on file    Inability: Not on file  . Transportation needs:    Medical: Not on file    Non-medical: Not on file  Tobacco Use  . Smoking status: Never Smoker  . Smokeless tobacco: Never Used  Substance and Sexual Activity  . Alcohol use: No  . Drug use: No  . Sexual activity: Never  Lifestyle  . Physical activity:    Days per week: Not on file    Minutes per session: Not on file  . Stress: Not on file  Relationships  . Social connections:    Talks on phone: Not on  file    Gets together: Not on file    Attends religious service: Not on file    Active member of club or organization: Not on file    Attends meetings of clubs or organizations: Not on file    Relationship status: Not on file  . Intimate partner violence:    Fear of current or ex partner: Not on file    Emotionally abused: Not on file    Physically abused: Not on file    Forced sexual activity: Not on file  Other Topics Concern  . Not on file  Social History Narrative  . Not on file    Medications:   Current Outpatient Medications on File Prior to Visit  Medication Sig Dispense Refill  . aspirin 325 MG tablet Take 1 tablet (325 mg total) by mouth daily. 30 tablet 0  . atorvastatin (LIPITOR) 40 MG tablet Take 1 tablet (40  mg total) by mouth daily at 6 PM. 30 tablet 0  . cetirizine (ZYRTEC) 10 MG tablet Take 10 mg daily as needed by mouth for allergies.    . folic acid (FOLVITE) 1 MG tablet Take by mouth.    . Iron-FA-B Cmp-C-Biot-Probiotic (FUSION PLUS PO) Take by mouth.    Marland Kitchen LACTOBACILLUS PO Take by mouth.    . levETIRAcetam (KEPPRA) 500 MG tablet Take 1 tablet (500 mg total) by mouth 2 (two) times daily. 60 tablet 7  . Olopatadine HCl (PAZEO) 0.7 % SOLN Apply 1 drop daily to eye.    . pantoprazole (PROTONIX) 40 MG tablet   1  . sitaGLIPtin-metformin (JANUMET) 50-500 MG tablet Take 1 tablet by mouth daily.      No current facility-administered medications on file prior to visit.     Allergies:   Allergies  Allergen Reactions  . Latex Hives    Physical Exam General: Mildly obese middle-aged African-American lady seated, in no evident distress Head: head normocephalic and atraumatic.  Neck: supple with no carotid or supraclavicular bruits Cardiovascular: regular rate and rhythm, no murmurs Musculoskeletal: no deformity Skin:  no rash/petichiae Vascular:  Normal pulses all extremities Vitals:   12/31/17 1133  BP: 128/90  Pulse: 76   Neurologic Exam Mental Status:  Awake and fully alert. Oriented to place and time. Recent and remote memory intact. Attention span, concentration and fund of knowledge appropriate. Mood and affect appropriate.  Cranial Nerves: Fundoscopic exam reveals sharp disc margins. Pupils equal, briskly reactive to light. Extraocular movements full without nystagmus. Visual fields full to confrontation. Hearing intact. Facial sensation intact. Face, tongue, palate moves normally and symmetrically.  Motor: Normal bulk and tone. Normal strength in all tested extremity muscles. Sensory.: intact to touch ,pinprick .position and vibratory sensation.  Coordination: Rapid alternating movements normal in all extremities. Finger-to-nose and heel-to-shin performed accurately bilaterally. Gait and Station: Arises from chair without difficulty. Stance is normal. Gait demonstrates normal stride length and balance . Able to heel, toe and tandem walk without difficulty.  Reflexes: 1+ and symmetric. Toes downgoing.    ASSESSMENT: 47 year old lady with multiple tiny right frontoparietal MCA branch infarcts of cryptogenic etiology in October 2018 with simple partial symptomatic seizures.  Vascular risk factors of uncontrolled hypertension, diabetes,hyperlipidemia, mild obesity and anemia.  Patient is being seen today for follow-up visit and overall is doing well without stroke/TIA symptoms or seizure activity.   PLAN: -Continue aspirin 325 mg daily  and lipitor  for secondary stroke prevention -Continue to take keppra 500mg  twice a day for seizure prophylaxis -Follow up with your PCP regarding your cholesterol, blood pressure and diabetes. -Advised patient that I will speak to Dr. Leonie Man regarding Lupron injection questions and safety of administration post stroke with history of seizures -Continue to monitor blood pressure at home -Continue to stay active and maintain a healthy diet Maintain strict control of hypertension with blood pressure goal below  130/90, diabetes with hemoglobin A1c goal below 6.5% and cholesterol with LDL cholesterol (bad cholesterol) goal below 70 mg/dL. I also advised the patient to eat a healthy diet with plenty of whole grains, cereals, fruits and vegetables, exercise regularly and maintain ideal body weight.  Followup in the future with me in 1 year or call earlier if needed  Greater than 50% time during this 25 minute consultation visit was spent on counseling and coordination of care about HTN, HLD, and  DM (risk factors), discussion about risk benefit of anticoagulation and answering questions.  Venancio Poisson, AGNP-BC  [facility] [address] [place],  [address]  Phone [phone] Fax [phone]  I reviewed the above note and documentation by the Nurse Practitioner and agree with the history, physical exam, assessment and plan as outlined above. I was immediately available for face-to-face consultation. Star Age, MD, PhD [facility])

## 2017-12-31 NOTE — Patient Instructions (Signed)
Continue aspirin 325 mg daily  and lipitor  for secondary stroke prevention  Continue to follow up with PCP regarding cholesterol, blood pressure and diabetes management   Continue keppra 500mg  twice daily to prevent seizures  Continue to stay active and maintain a healthy diet  Continue to monitor blood pressure at home  Maintain strict control of hypertension with blood pressure goal below 130/90, diabetes with hemoglobin A1c goal below 6.5% and cholesterol with LDL cholesterol (bad cholesterol) goal below 70 mg/dL. I also advised the patient to eat a healthy diet with plenty of whole grains, cereals, fruits and vegetables, exercise regularly and maintain ideal body weight.  Followup in the future with me in 1 year or call earlier if needed       Thank you for coming to see [place] at [facility]. I hope we have been able to provide you high quality care today.  You may receive a patient satisfaction survey over the next few weeks. We would appreciate your feedback and comments so that we may continue to improve ourselves and the health of our patients.

## 2018-01-01 ENCOUNTER — Telehealth: Payer: Self-pay

## 2018-01-01 DIAGNOSIS — D500 Iron deficiency anemia secondary to blood loss (chronic): Secondary | ICD-10-CM | POA: Diagnosis not present

## 2018-01-01 NOTE — Progress Notes (Signed)
I agree with the above plan 

## 2018-01-01 NOTE — Telephone Encounter (Signed)
Kelly Poisson, NP  Marval Regal, RN        Please advise patient that she will be safe to use Lupron injections. Please have her notify her OB. Thank you

## 2018-01-01 NOTE — Telephone Encounter (Signed)
Left vm for patient to call back about lupron injections. This is a medication manage by her obgyn.

## 2018-01-05 NOTE — Telephone Encounter (Signed)
Left 2nd vm for patient to call back

## 2018-01-06 NOTE — Telephone Encounter (Signed)
Left third message on vm about lupron injection she had a question about, and the recommendations.

## 2018-01-29 DIAGNOSIS — Z0000 Encounter for general adult medical examination without abnormal findings: Secondary | ICD-10-CM | POA: Diagnosis not present

## 2018-02-02 DIAGNOSIS — Z0000 Encounter for general adult medical examination without abnormal findings: Secondary | ICD-10-CM | POA: Diagnosis not present

## 2018-02-02 DIAGNOSIS — Z23 Encounter for immunization: Secondary | ICD-10-CM | POA: Diagnosis not present

## 2018-02-06 ENCOUNTER — Other Ambulatory Visit: Payer: Self-pay | Admitting: Family Medicine

## 2018-02-06 DIAGNOSIS — Z1231 Encounter for screening mammogram for malignant neoplasm of breast: Secondary | ICD-10-CM

## 2018-03-23 DIAGNOSIS — E782 Mixed hyperlipidemia: Secondary | ICD-10-CM | POA: Diagnosis not present

## 2018-03-23 DIAGNOSIS — I10 Essential (primary) hypertension: Secondary | ICD-10-CM | POA: Diagnosis not present

## 2018-03-23 DIAGNOSIS — E119 Type 2 diabetes mellitus without complications: Secondary | ICD-10-CM | POA: Diagnosis not present

## 2018-03-25 DIAGNOSIS — I10 Essential (primary) hypertension: Secondary | ICD-10-CM | POA: Diagnosis not present

## 2018-03-25 DIAGNOSIS — Z8673 Personal history of transient ischemic attack (TIA), and cerebral infarction without residual deficits: Secondary | ICD-10-CM | POA: Diagnosis not present

## 2018-03-25 DIAGNOSIS — E119 Type 2 diabetes mellitus without complications: Secondary | ICD-10-CM | POA: Diagnosis not present

## 2018-03-26 DIAGNOSIS — D500 Iron deficiency anemia secondary to blood loss (chronic): Secondary | ICD-10-CM | POA: Diagnosis not present

## 2018-04-06 DIAGNOSIS — D500 Iron deficiency anemia secondary to blood loss (chronic): Secondary | ICD-10-CM | POA: Diagnosis not present

## 2018-04-06 DIAGNOSIS — N920 Excessive and frequent menstruation with regular cycle: Secondary | ICD-10-CM | POA: Diagnosis not present

## 2018-04-13 DIAGNOSIS — D509 Iron deficiency anemia, unspecified: Secondary | ICD-10-CM | POA: Diagnosis not present

## 2018-05-13 ENCOUNTER — Telehealth: Payer: Self-pay | Admitting: Adult Health

## 2018-05-13 NOTE — Telephone Encounter (Addendum)
I spoke with Janett Billow NP stating pt wanted to be seen for her left hand hurting. Janett Billow NP reviewed the chart, and stated when pt had stroke 01/2017 all of her symptoms had resolve with no issues. When pt was seen 12/2017 she was doing well with no deficits from her stroke. Janett Billow NP recommend pt see her PCP first for evaluation. Appt for her should be cancel on 05/15/2018.

## 2018-05-13 NOTE — Telephone Encounter (Signed)
revised 

## 2018-05-13 NOTE — Telephone Encounter (Signed)
Pt has called to inform that her left hand(which is side her stroke was on) is hurting and she is having dificulty stretching her fingers without using other hand to help.  Pt has accepted 1st available appointment with NP Janett Billow.  Pt states the pain is not bad enough at this time to go to ED but she would like a call from RN.  Pt states if RN calls before 2pm please call [phone] if not please use the [phone]

## 2018-05-13 NOTE — Telephone Encounter (Signed)
I called patient about her complaints of left hand stiffness and hurting. Pt stated it feels like a trigger finger issues and they are stiff. I explained per Janett Billow NP all of her stroke deficits had resolve in 01/2017, and at her last two visits she had not complaints. I stated per Janett Billow NP her appt on 05/15/2018 needs to be cancel and pt needs to follow up with her primary doctor for her left hand stiffness. Pt verbalized understanding of appt will be cancel and call PCP.

## 2018-05-13 NOTE — Telephone Encounter (Signed)
I last saw Kelly Davies in 12/2017 without any residual stroke deficits.  It is recommended for her to follow with her PCP for further evaluation of left hand pain and difficulty with stretching fingers as activity multiple modalities causing these symptoms.

## 2018-05-14 DIAGNOSIS — M65312 Trigger thumb, left thumb: Secondary | ICD-10-CM | POA: Diagnosis not present

## 2018-05-14 DIAGNOSIS — M79642 Pain in left hand: Secondary | ICD-10-CM | POA: Diagnosis not present

## 2018-05-15 ENCOUNTER — Ambulatory Visit: Payer: 59 | Admitting: Adult Health

## 2018-06-04 DIAGNOSIS — M65312 Trigger thumb, left thumb: Secondary | ICD-10-CM | POA: Diagnosis not present

## 2018-06-17 DIAGNOSIS — Z130 Encounter for screening for diseases of the blood and blood-forming organs and certain disorders involving the immune mechanism: Secondary | ICD-10-CM | POA: Diagnosis not present

## 2018-06-17 DIAGNOSIS — D509 Iron deficiency anemia, unspecified: Secondary | ICD-10-CM | POA: Diagnosis not present

## 2018-06-29 DIAGNOSIS — M79642 Pain in left hand: Secondary | ICD-10-CM | POA: Diagnosis not present

## 2018-06-29 DIAGNOSIS — M65312 Trigger thumb, left thumb: Secondary | ICD-10-CM | POA: Diagnosis not present

## 2018-08-04 DIAGNOSIS — G40909 Epilepsy, unspecified, not intractable, without status epilepticus: Secondary | ICD-10-CM | POA: Diagnosis not present

## 2018-08-04 DIAGNOSIS — E782 Mixed hyperlipidemia: Secondary | ICD-10-CM | POA: Diagnosis not present

## 2018-08-04 DIAGNOSIS — D649 Anemia, unspecified: Secondary | ICD-10-CM | POA: Diagnosis not present

## 2018-09-26 IMAGING — CT CT ANGIO HEAD
1 of 12 series · 4 of 33 positions shown · IV contrast (isovue)
Comparison: Brain MRI 01/15/2017

CLINICAL DATA: Stroke follow-up

EXAM:
CT ANGIOGRAPHY HEAD AND NECK
TECHNIQUE: Multidetector CT imaging of the head and neck was performed using
the standard protocol during bolus administration of intravenous
contrast. Multiplanar CT image reconstructions and MIPs were
obtained to evaluate the vascular anatomy. Carotid stenosis
measurements (when applicable) are obtained utilizing NASCET
criteria, using the distal internal carotid diameter as the
denominator.
CONTRAST:  50 mL Isovue 370

[Series 11: ax thins · axial · 0.39mm/px · z∈[-256,-60]mm · 4 of 328 slices shown]
[im 66/328  soft-tissue]
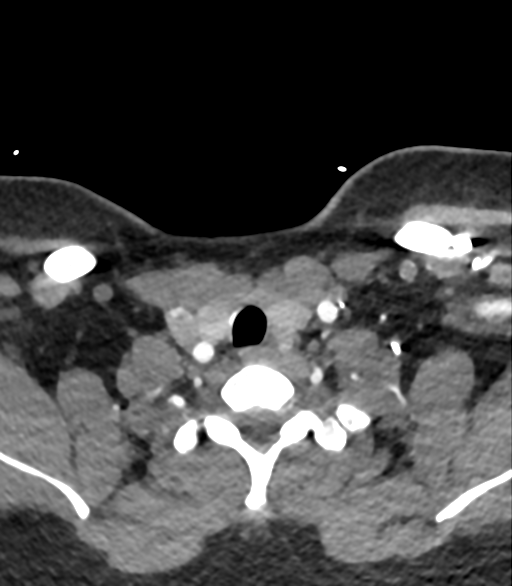
[im 131/328  bone]
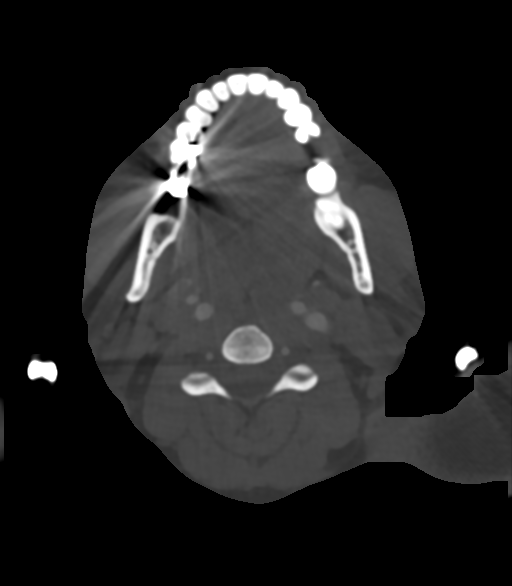
[im 197/328  soft-tissue]
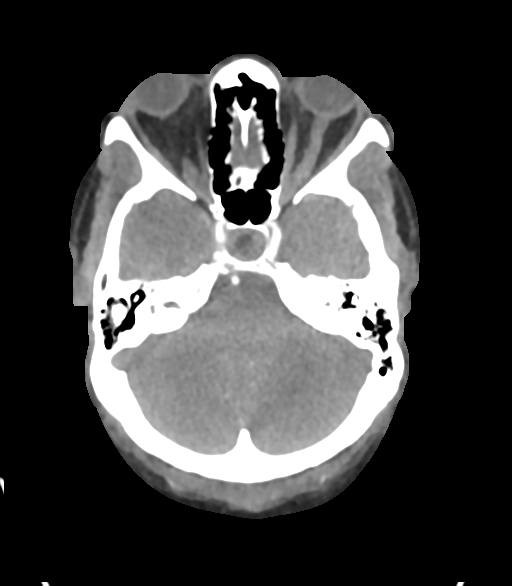
[im 262/328  bone]
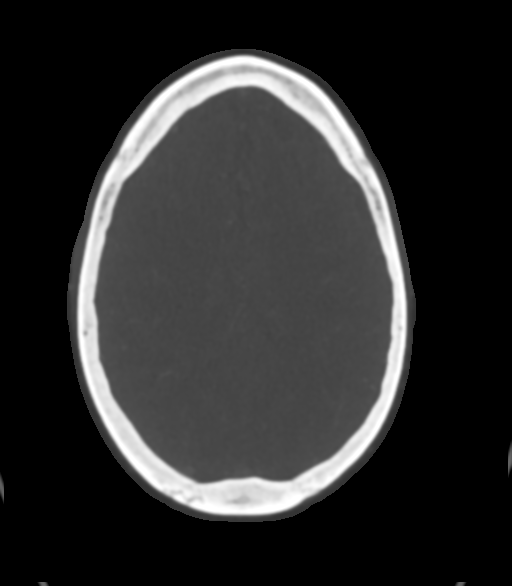

[4 of 33 positions shown; findings below may reference images not displayed]

FINDINGS: CT HEAD FINDINGS

Brain: No mass lesion, intraparenchymal hemorrhage or extra-axial
collection. No evidence of acute cortical infarct. Brain parenchyma
and CSF-containing spaces are normal for age.

Vascular: No hyperdense vessel or unexpected calcification.

Skull: Normal visualized skull base, calvarium and extracranial soft
tissues.

Sinuses/Orbits: No sinus fluid levels or advanced mucosal
thickening. No mastoid effusion. Normal orbits.

CTA NECK FINDINGS

Aortic arch: There is no aneurysm or dissection of the visualized
ascending aorta or aortic arch. Normal 3 vessel aortic branching
pattern. The visualized proximal subclavian arteries are normal.

Right carotid system: There is a focal enhancement abnormality
within the proximal right common carotid artery. It has a
discontinuous appearance and appears to be artifactual. This is at
approximately the level of the infraglottic larynx. The remainder of
the right common and internal carotid arteries is normal.

Left carotid system: The left common carotid origin is widely
patent. There is no common carotid or internal carotid artery
dissection or aneurysm. No hemodynamically significant stenosis.

Vertebral arteries: The vertebral system is codominant. Both
vertebral artery origins are normal. Both vertebral arteries are
normal to their confluence with the basilar artery.

Skeleton: There is no bony spinal canal stenosis. No lytic or
blastic lesions.

Other neck: There is prominent pharyngeal soft tissue with likely
positional narrowing of the airway, as may be seen in sleep apnea.

Upper chest: No pneumothorax or pleural effusion. No nodules or
masses.

Review of the MIP images confirms the above findings

CTA HEAD FINDINGS

Anterior circulation:

--Intracranial internal carotid arteries: Normal.

--Anterior cerebral arteries: Normal.

--Middle cerebral arteries: Normal.

--Posterior communicating arteries: Absent bilaterally.

Posterior circulation:

--Posterior cerebral arteries: Normal.

--Superior cerebellar arteries: Normal.

--Basilar artery: Normal.

--Anterior inferior cerebellar arteries: Normal.

--Posterior inferior cerebellar arteries: Normal.

Venous sinuses: As permitted by contrast timing, patent.

Anatomic variants: None

Delayed phase: No parenchymal contrast enhancement.

Review of the MIP images confirms the above findings
IMPRESSION: 1. Focal enhancement abnormality of the proximal right common
carotid artery, at approximately the level of the subglottic larynx.
Given the somewhat discontinuous appearance, I suspect that this is
artifact, perhaps related to swallowing or laryngeal motion.
However, in the context of multifocal acute ischemia within the
right cerebral hemisphere, MRA of the neck with contrast is
recommended for confirmation.
2. Otherwise normal examination of the arteries of the head and
neck.
3. Prominent lower pharyngeal soft tissues with positional narrowing
of the airway, as may be seen in the setting of sleep apnea.

## 2019-01-04 ENCOUNTER — Ambulatory Visit: Payer: 59 | Admitting: Adult Health

## 2019-01-04 ENCOUNTER — Encounter: Payer: Self-pay | Admitting: Adult Health

## 2019-01-04 ENCOUNTER — Other Ambulatory Visit: Payer: Self-pay

## 2019-01-04 VITALS — BP 161/95 | HR 76 | Temp 97.7°F | Ht 62.0 in | Wt 231.4 lb

## 2019-01-04 DIAGNOSIS — I10 Essential (primary) hypertension: Secondary | ICD-10-CM | POA: Diagnosis not present

## 2019-01-04 DIAGNOSIS — E785 Hyperlipidemia, unspecified: Secondary | ICD-10-CM

## 2019-01-04 DIAGNOSIS — I639 Cerebral infarction, unspecified: Secondary | ICD-10-CM

## 2019-01-04 DIAGNOSIS — G40209 Localization-related (focal) (partial) symptomatic epilepsy and epileptic syndromes with complex partial seizures, not intractable, without status epilepticus: Secondary | ICD-10-CM

## 2019-01-04 DIAGNOSIS — E119 Type 2 diabetes mellitus without complications: Secondary | ICD-10-CM

## 2019-01-04 MED ORDER — LEVETIRACETAM 500 MG PO TABS: 500.0000 mg | ORAL_TABLET | Freq: Two times a day (BID) | ORAL | 3 refills | Status: DC

## 2019-01-04 NOTE — Progress Notes (Signed)
[facility] [address] [place]. [place] [address] [phone]       OFFICE FOLLOW-UP NOTE  Kelly Davies Date of Birth:  03/10/1971 Medical Record Number:  [id number]   HPI:  Initial visit PS: Kelly Nakagawa Mooreis a 48 y.o.femalewho was in her normal state of health until around 10:30  On 01/14/2017 when she began having numbness and tingling in her left hand. She states that started in her hand and moved upwards and then she began having jerking movements of her arm. She describes her entire arm jerking. She was not sure if her face or leg were normal. The jerking lasted for approximately 1 minute and for about 20-30 minutes following that she had left-sided weakness. Due to the left-sided weakness, EMS activated code stroke, but she was resolved by the time of arrival to the emergency department with the exception of very mild tingling in her fingertips on the left. LKW: 10:30 PM on 01/14/2017.tpa given?: no, resolution of symptoms CT scan of the head on admission.  Showed no abnormalities.  CT angiogram showed no significant large vessel extracranial or intracranial stenosis.  MR scan of the brain showed multiple tiny acute right frontoparietal infarcts.  Transthoracic echo showed normal ejection fraction.  Transesophageal echocardiogram showed no cardiac source of embolism or PFO.  LDL cholesterol 110 mg percent.  Hemoglobin A1c was significantly elevated at 10.5%.  Patient was started on aspirin and Lipitor as well as Keppra for seizures.  She had  outpatient 30-day cardiac monitoring done by her cardiologist Dr. Virgina Jock which was negative for any atrial fibrillation.  She states she is tolerating aspirin well without bruising or bleeding.  She has had no recurrent stroke or TIA symptoms.  Her blood pressure is well controlled and today it is 135/88.  Her fasting sugars have also been quite good in the 90s only.  She is tolerating Lipitor well without muscle aches and  pains.  She is eating healthy and has lost 10 pounds.  She plans to be more active.  She does admit to snoring as well as having daytime sleepiness and napping easily.  She has not been evaluated for sleep apnea.   06/30/17 visit: Patient is being seen today for six-month follow-up.  Overall has been doing well without new or worsening stroke/TIA symptoms or seizure activity.  Continues to take aspirin without increase in bleeding or bruising.  Continues to take Lipitor without myalgias.  Continues to take heparin which she is tolerating well and denies seizure activity.  Did undergo a 30-day cardiac monitor which was negative for atrial fibrillation.  Sleep apnea referral was placed at last visit but per patient, she was hospitalized in January 2019 with a diagnosis of anemia which she received RBC transfusions and iron infusions.  Since this, she has been feeling much better without constant fatigue.  She would like to hold off on sleep apnea testing at this time.  Continues to work in [place] at [employer] where she does drive back and forth daily.  Recent LDL 68 and A1c 6.2 drawn on 04/23/2017.  Blood pressure today's is elevated at 154/99.  States she does check blood pressure at home with SBP 130s.  Has been eating healthier and exercising when able.  No new concerns or issues at today's visit.  12/31/2017 visit: Patient is being seen for scheduled follow-up visit.  She states overall she is been doing well and denies recent stroke/TIA symptoms or seizure activity.  She continues  to take Keppra 500 mg twice daily.  Continues to take aspirin without side effects of bleeding or bruising.  Continues to take Lipitor without side effects myalgias.  Blood pressure today 128/90.  Does monitor glucose levels at home with most recent A1c 5.4.  She continues to receive iron infusions for anemia.  Recent follow-up with OB/GYN and questioning whether she can start Lupron injections as patient does need a hysterectomy  eventually but the goal of Lupron injection is to shrink uterus so that she can undergo laparoscopic hysterectomy.  Patient states that she was told she will need these injections for approximately 6 months but was advised to speak to [place] regarding his injections for possible contraindications due to stroke and seizures.  Denies new or worsening stroke/TIA symptoms or seizure activity.  Update 01/04/2019: Kelly Davies is a 48 year old female who is being seen today for stroke and seizure follow-up.  She has been stable from a neurological standpoint without stroke/TIA symptoms or seizure activity.  She continues to work at [employer] and maintains daily functioning/activity without difficulty.  She continues on Keppra 500 mg twice daily without side effects.  Continues on aspirin and atorvastatin for secondary stroke prevention without side effects. Glucose levels have been stable. Blood pressure at today's visit 161/95, asymptomatic, patient reporting whitecoat syndrome. Typically at home 120s/70s.  No further concerns at this time.     ROS:   14 system review of systems is positive for no complaints and all other systems negative  PMH:  Past Medical History:  Diagnosis Date  . Diabetes mellitus without complication ([place])   . Hypercholesteremia   . Hypertension   . Partial seizure ([place])   . Stroke ([place])   . TIA (transient ischemic attack)     Social History:  Social History   Socioeconomic History  . Marital status: Single    Spouse name: Not on file  . Number of children: Not on file  . Years of education: Not on file  . Highest education level: Not on file  Occupational History  . Not on file  Social Needs  . Financial resource strain: Not on file  . Food insecurity    Worry: Not on file    Inability: Not on file  . Transportation needs    Medical: Not on file    Non-medical: Not on file  Tobacco Use  . Smoking status: Never Smoker  . Smokeless tobacco: Never Used  Substance and  Sexual Activity  . Alcohol use: No  . Drug use: No  . Sexual activity: Never  Lifestyle  . Physical activity    Days per week: Not on file    Minutes per session: Not on file  . Stress: Not on file  Relationships  . Social Herbalist on phone: Not on file    Gets together: Not on file    Attends religious service: Not on file    Active member of club or organization: Not on file    Attends meetings of clubs or organizations: Not on file    Relationship status: Not on file  . Intimate partner violence    Fear of current or ex partner: Not on file    Emotionally abused: Not on file    Physically abused: Not on file    Forced sexual activity: Not on file  Other Topics Concern  . Not on file  Social History Narrative  . Not on file    Medications:  Current Outpatient Medications on File Prior to Visit  Medication Sig Dispense Refill  . aspirin 325 MG tablet Take 1 tablet (325 mg total) by mouth daily. 30 tablet 0  . atorvastatin (LIPITOR) 40 MG tablet Take 1 tablet (40 mg total) by mouth daily at 6 PM. 30 tablet 0  . cetirizine (ZYRTEC) 10 MG tablet Take 10 mg daily as needed by mouth for allergies.    . folic acid (FOLVITE) 1 MG tablet Take by mouth.    . Iron-FA-B Cmp-C-Biot-Probiotic (FUSION PLUS PO) Take by mouth.    Marland Kitchen LACTOBACILLUS PO Take by mouth.    . levETIRAcetam (KEPPRA) 500 MG tablet Take 1 tablet (500 mg total) by mouth 2 (two) times daily. 60 tablet 7  . Olopatadine HCl (PAZEO) 0.7 % SOLN Apply 1 drop daily to eye.    . pantoprazole (PROTONIX) 40 MG tablet   1  . sitaGLIPtin-metformin (JANUMET) 50-500 MG tablet Take 1 tablet by mouth daily.      No current facility-administered medications on file prior to visit.     Allergies:   Allergies  Allergen Reactions  . Latex Hives    Physical Exam  Today's Vitals   01/04/19 1113  BP: (!) 161/95  Pulse: 76  Temp: 97.7 F (36.5 C)  TempSrc: Oral  Weight: 231 lb 6.4 oz (105 kg)  Height: 5\' 2"   (1.575 m)   Body mass index is 42.32 kg/m.   General: Mildly obese middle-aged African-American lady seated, in no evident distress Head: head normocephalic and atraumatic.  Neck: supple with no carotid or supraclavicular bruits Cardiovascular: regular rate and rhythm, no murmurs Musculoskeletal: no deformity Skin:  no rash/petichiae Vascular:  Normal pulses all extremities  Neurologic Exam Mental Status: Awake and fully alert. Oriented to place and time. Recent and remote memory intact. Attention span, concentration and fund of knowledge appropriate. Mood and affect appropriate.  Cranial Nerves: Pupils equal, briskly reactive to light. Extraocular movements full without nystagmus. Visual fields full to confrontation. Hearing intact. Facial sensation intact. Face, tongue, palate moves normally and symmetrically.  Motor: Normal bulk and tone. Normal strength in all tested extremity muscles. Sensory.: intact to touch ,pinprick .position and vibratory sensation.  Coordination: Rapid alternating movements normal in all extremities. Finger-to-nose and heel-to-shin performed accurately bilaterally. Gait and Station: Arises from chair without difficulty. Stance is normal. Gait demonstrates normal stride length and balance . Able to heel, toe and tandem walk without difficulty.  Reflexes: 1+ and symmetric. Toes downgoing.    ASSESSMENT: 48 year old lady with multiple tiny right frontoparietal MCA branch infarcts of cryptogenic etiology in October 2018 with simple partial symptomatic seizures.  Vascular risk factors of uncontrolled hypertension, diabetes,hyperlipidemia, mild obesity and anemia.  Patient is being seen today for follow-up visit and overall is doing well without stroke/TIA symptoms or seizure activity.   PLAN: -Continue aspirin 325 mg daily  and lipitor  for secondary stroke prevention -Continue to take keppra 500mg  twice a day for seizure prophylaxis -Follow up with your PCP  regarding your cholesterol, blood pressure and diabetes. -Continue to monitor blood pressure at home -Continue to stay active and maintain a healthy diet Maintain strict control of hypertension with blood pressure goal below 130/90, diabetes with hemoglobin A1c goal below 6.5% and cholesterol with LDL cholesterol (bad cholesterol) goal below 70 mg/dL. I also advised the patient to eat a healthy diet with plenty of whole grains, cereals, fruits and vegetables, exercise regularly and maintain ideal body weight.  Followup in the  future with me in 1 year or call earlier if needed  Greater than 50% time during this 25 minute consultation visit was spent on counseling and coordination of care about HTN, HLD, and  DM (risk factors), discussion regarding ongoing use of AED for seizure prevention and answered all questions to patient satisfaction   Frann Rider, AGNP-BC  [facility] [address] [place], [place] [address]  Phone [phone] Fax [phone]  I reviewed the above note and documentation by the Nurse Practitioner and agree with the history, physical exam, assessment and plan as outlined above. I was immediately available for face-to-face consultation. Star Age, MD, PhD [facility])

## 2019-01-04 NOTE — Progress Notes (Signed)
I agree with the above plan 

## 2019-01-04 NOTE — Patient Instructions (Signed)
Your Plan:  Continue Keppra 500 mg twice daily for seizure prevention  Call office with any further seizures or seizure activity  Continue aspirin and Lipitor for secondary stroke prevention   Follow-up in 1 year or call earlier if needed       Thank you for coming to see [place] at [facility]. I hope we have been able to provide you high quality care today.  You may receive a patient satisfaction survey over the next few weeks. We would appreciate your feedback and comments so that we may continue to improve ourselves and the health of our patients.

## 2019-12-09 ENCOUNTER — Encounter: Payer: Self-pay | Admitting: Adult Health

## 2020-01-04 ENCOUNTER — Ambulatory Visit: Payer: Self-pay | Admitting: Adult Health

## 2020-01-31 ENCOUNTER — Ambulatory Visit: Payer: Self-pay | Admitting: Adult Health

## 2020-02-09 ENCOUNTER — Ambulatory Visit: Payer: Self-pay | Admitting: Adult Health

## 2020-02-09 ENCOUNTER — Encounter: Payer: Self-pay | Admitting: Adult Health

## 2020-02-09 NOTE — Progress Notes (Deleted)
[facility] [address] [place]. [place] [address] [phone]       OFFICE FOLLOW-UP NOTE  Ms. CARMALITA WAKEFIELD Date of Birth:  1970-12-07 Medical Record Number:  [id number]     No chief complaint on file.    HPI:   Today, 02/09/2020, Ms. Isley returns for yearly follow-up with history of stroke and seizures.  She has been stable since prior visit without new or reoccurring stroke/TIA symptoms and remains on aspirin and atorvastatin without side effects.  Blood pressure today ***.  Glucose levels ***.  She remains on Keppra 500 mg twice daily without reoccurring seizure activity or symptoms tolerating well without side effects.  She continues to follow with hematology receiving IV iron infusions for iron deficiency anemia.  No concerns at this time.     History provided for reference purposes only Update 01/04/2019 JM: Ms. Nilson is a 49 year old female who is being seen today for stroke and seizure follow-up.  She has been stable from a neurological standpoint without stroke/TIA symptoms or seizure activity.  She continues to work at [employer] and maintains daily functioning/activity without difficulty.  She continues on Keppra 500 mg twice daily without side effects.  Continues on aspirin and atorvastatin for secondary stroke prevention without side effects. Glucose levels have been stable. Blood pressure at today's visit 161/95, asymptomatic, patient reporting whitecoat syndrome. Typically at home 120s/70s.  No further concerns at this time.   12/31/2017 visit JM: Patient is being seen for scheduled follow-up visit.  She states overall she is been doing well and denies recent stroke/TIA symptoms or seizure activity.  She continues to take Keppra 500 mg twice daily.  Continues to take aspirin without side effects of bleeding or bruising.  Continues to take Lipitor without side effects myalgias.  Blood pressure today 128/90.  Does monitor glucose levels at home with most  recent A1c 5.4.  She continues to receive iron infusions for anemia.  Recent follow-up with OB/GYN and questioning whether she can start Lupron injections as patient does need a hysterectomy eventually but the goal of Lupron injection is to shrink uterus so that she can undergo laparoscopic hysterectomy.  Patient states that she was told she will need these injections for approximately 6 months but was advised to speak to [place] regarding his injections for possible contraindications due to stroke and seizures.  Denies new or worsening stroke/TIA symptoms or seizure activity.  06/30/17 visit: Patient is being seen today for six-month follow-up.  Overall has been doing well without new or worsening stroke/TIA symptoms or seizure activity.  Continues to take aspirin without increase in bleeding or bruising.  Continues to take Lipitor without myalgias.  Continues to take heparin which she is tolerating well and denies seizure activity.  Did undergo a 30-day cardiac monitor which was negative for atrial fibrillation.  Sleep apnea referral was placed at last visit but per patient, she was hospitalized in January 2019 with a diagnosis of anemia which she received RBC transfusions and iron infusions.  Since this, she has been feeling much better without constant fatigue.  She would like to hold off on sleep apnea testing at this time.  Continues to work in [place] at [employer] where she does drive back and forth daily.  Recent LDL 68 and A1c 6.2 drawn on 04/23/2017.  Blood pressure today's is elevated at 154/99.  States she does check blood pressure at home with SBP 130s.  Has been eating healthier and exercising when able.  No new concerns or issues at today's visit.  Initial visit 04/01/2017 PS: Anjelique Makar Mooreis a 49 y.o.femalewho was in her normal state of health until around 10:30  On 01/14/2017 when she began having numbness and tingling in her left hand. She states that started in her hand and moved upwards and  then she began having jerking movements of her arm. She describes her entire arm jerking. She was not sure if her face or leg were normal. The jerking lasted for approximately 1 minute and for about 20-30 minutes following that she had left-sided weakness. Due to the left-sided weakness, EMS activated code stroke, but she was resolved by the time of arrival to the emergency department with the exception of very mild tingling in her fingertips on the left. LKW: 10:30 PM on 01/14/2017.tpa given?: no, resolution of symptoms CT scan of the head on admission.  Showed no abnormalities.  CT angiogram showed no significant large vessel extracranial or intracranial stenosis.  MR scan of the brain showed multiple tiny acute right frontoparietal infarcts.  Transthoracic echo showed normal ejection fraction.  Transesophageal echocardiogram showed no cardiac source of embolism or PFO.  LDL cholesterol 110 mg percent.  Hemoglobin A1c was significantly elevated at 10.5%.  Patient was started on aspirin and Lipitor as well as Keppra for seizures.  She had  outpatient 30-day cardiac monitoring done by her cardiologist Dr. Virgina Jock which was negative for any atrial fibrillation.  She states she is tolerating aspirin well without bruising or bleeding.  She has had no recurrent stroke or TIA symptoms.  Her blood pressure is well controlled and today it is 135/88.  Her fasting sugars have also been quite good in the 90s only.  She is tolerating Lipitor well without muscle aches and pains.  She is eating healthy and has lost 10 pounds.  She plans to be more active.  She does admit to snoring as well as having daytime sleepiness and napping easily.  She has not been evaluated for sleep apnea.     ROS:   14 system review of systems is positive for no complaints and all other systems negative  PMH:  Past Medical History:  Diagnosis Date  . Diabetes mellitus without complication ([place])   . Hypercholesteremia   . Hypertension   .  Partial seizure ([place])   . Stroke ([place])   . TIA (transient ischemic attack)     Social History:  Social History   Socioeconomic History  . Marital status: Single    Spouse name: Not on file  . Number of children: Not on file  . Years of education: Not on file  . Highest education level: Not on file  Occupational History  . Not on file  Tobacco Use  . Smoking status: Never Smoker  . Smokeless tobacco: Never Used  Substance and Sexual Activity  . Alcohol use: No  . Drug use: No  . Sexual activity: Never  Other Topics Concern  . Not on file  Social History Narrative  . Not on file   Social Determinants of Health   Financial Resource Strain:   . Difficulty of Paying Living Expenses: Not on file  Food Insecurity:   . Worried About Charity fundraiser in the Last Year: Not on file  . Ran Out of Food in the Last Year: Not on file  Transportation Needs:   . Lack of Transportation (Medical): Not on file  . Lack of Transportation (Non-Medical): Not on file  Physical  Activity:   . Days of Exercise per Week: Not on file  . Minutes of Exercise per Session: Not on file  Stress:   . Feeling of Stress : Not on file  Social Connections:   . Frequency of Communication with Friends and Family: Not on file  . Frequency of Social Gatherings with Friends and Family: Not on file  . Attends Religious Services: Not on file  . Active Member of Clubs or Organizations: Not on file  . Attends Archivist Meetings: Not on file  . Marital Status: Not on file  Intimate Partner Violence:   . Fear of Current or Ex-Partner: Not on file  . Emotionally Abused: Not on file  . Physically Abused: Not on file  . Sexually Abused: Not on file    Medications:   Current Outpatient Medications on File Prior to Visit  Medication Sig Dispense Refill  . aspirin 325 MG tablet Take 1 tablet (325 mg total) by mouth daily. 30 tablet 0  . atorvastatin (LIPITOR) 40 MG tablet Take 1 tablet (40 mg total)  by mouth daily at 6 PM. 30 tablet 0  . cetirizine (ZYRTEC) 10 MG tablet Take 10 mg daily as needed by mouth for allergies.    . folic acid (FOLVITE) 1 MG tablet Take by mouth.    . Iron-FA-B Cmp-C-Biot-Probiotic (FUSION PLUS PO) Take by mouth.    Marland Kitchen LACTOBACILLUS PO Take by mouth.    . levETIRAcetam (KEPPRA) 500 MG tablet Take 1 tablet (500 mg total) by mouth 2 (two) times daily. 180 tablet 3  . Olopatadine HCl (PAZEO) 0.7 % SOLN Apply 1 drop daily to eye.    . pantoprazole (PROTONIX) 40 MG tablet   1  . sitaGLIPtin-metformin (JANUMET) 50-500 MG tablet Take 1 tablet by mouth daily.      No current facility-administered medications on file prior to visit.    Allergies:   Allergies  Allergen Reactions  . Latex Hives    Physical Exam  There were no vitals filed for this visit. There is no height or weight on file to calculate BMI.   General: Mildly obese middle-aged African-American lady seated, in no evident distress Head: head normocephalic and atraumatic.  Neck: supple with no carotid or supraclavicular bruits Cardiovascular: regular rate and rhythm, no murmurs Musculoskeletal: no deformity Skin:  no rash/petichiae Vascular:  Normal pulses all extremities  Neurologic Exam Mental Status: Awake and fully alert. Oriented to place and time. Recent and remote memory intact. Attention span, concentration and fund of knowledge appropriate. Mood and affect appropriate.  Cranial Nerves: Pupils equal, briskly reactive to light. Extraocular movements full without nystagmus. Visual fields full to confrontation. Hearing intact. Facial sensation intact. Face, tongue, palate moves normally and symmetrically.  Motor: Normal bulk and tone. Normal strength in all tested extremity muscles. Sensory.: intact to touch ,pinprick .position and vibratory sensation.  Coordination: Rapid alternating movements normal in all extremities. Finger-to-nose and heel-to-shin performed accurately bilaterally. Gait  and Station: Arises from chair without difficulty. Stance is normal. Gait demonstrates normal stride length and balance . Able to heel, toe and tandem walk without difficulty.  Reflexes: 1+ and symmetric. Toes downgoing.    ASSESSMENT/PLAN: 49 year old lady with multiple tiny right frontoparietal MCA branch infarcts of cryptogenic etiology in October 2018 with simple partial symptomatic seizures.  Vascular risk factors of uncontrolled hypertension, diabetes,hyperlipidemia, mild obesity and iron deficiency anemia.     -Continue aspirin 325 mg daily  and lipitor  for secondary stroke prevention -  Continue to take keppra 500mg  twice a day for seizure prophylaxis -Follow up with your PCP regarding your cholesterol, blood pressure and diabetes. -Continue to monitor blood pressure at home -Continue to stay active and maintain a healthy diet -Discussed secondary stroke prevention measures and importance of close PCP follow-up to maintain strict control of hypertension with blood pressure goal below 130/90, diabetes with hemoglobin A1c goal below 7.0% and cholesterol with LDL cholesterol (bad cholesterol) goal below 70 mg/dL.   Followup in the future with me in 1 year or call earlier if needed  I spent *** minutes of face-to-face and non-face-to-face time with patient.  This included previsit chart review, lab review, study review, order entry, electronic health record documentation, patient education   Frann Rider, [facility]  [facility] [address] [place], [place] [address]  Phone [phone] Fax [phone]  I reviewed the above note and documentation by the Nurse Practitioner and agree with the history, physical exam, assessment and plan as outlined above. I was immediately available for face-to-face consultation. Star Age, MD, PhD [facility])

## 2020-02-17 ENCOUNTER — Other Ambulatory Visit: Payer: Self-pay | Admitting: Adult Health

## 2020-03-07 ENCOUNTER — Other Ambulatory Visit: Payer: Self-pay | Admitting: Family Medicine

## 2020-03-07 DIAGNOSIS — Z1231 Encounter for screening mammogram for malignant neoplasm of breast: Secondary | ICD-10-CM

## 2020-05-31 ENCOUNTER — Ambulatory Visit
Admission: RE | Admit: 2020-05-31 | Discharge: 2020-05-31 | Disposition: A | Discharge status: Home / Self Care | Payer: 59 | Source: Ambulatory Visit | Attending: Family Medicine | Admitting: Family Medicine

## 2020-05-31 ENCOUNTER — Ambulatory Visit: Payer: 59

## 2020-05-31 ENCOUNTER — Other Ambulatory Visit: Payer: Self-pay

## 2020-05-31 DIAGNOSIS — Z1231 Encounter for screening mammogram for malignant neoplasm of breast: Secondary | ICD-10-CM

## 2020-06-05 ENCOUNTER — Other Ambulatory Visit: Payer: Self-pay | Admitting: Family Medicine

## 2020-06-05 DIAGNOSIS — R928 Other abnormal and inconclusive findings on diagnostic imaging of breast: Secondary | ICD-10-CM

## 2020-06-17 ENCOUNTER — Other Ambulatory Visit: Payer: Self-pay

## 2020-06-17 ENCOUNTER — Other Ambulatory Visit: Payer: Self-pay | Admitting: Family Medicine

## 2020-06-17 ENCOUNTER — Ambulatory Visit
Admission: RE | Admit: 2020-06-17 | Discharge: 2020-06-17 | Disposition: A | Discharge status: Home / Self Care | Payer: 59 | Source: Ambulatory Visit | Attending: Family Medicine | Admitting: Family Medicine

## 2020-06-17 DIAGNOSIS — R928 Other abnormal and inconclusive findings on diagnostic imaging of breast: Secondary | ICD-10-CM

## 2020-06-23 ENCOUNTER — Ambulatory Visit
Admission: RE | Admit: 2020-06-23 | Discharge: 2020-06-23 | Disposition: A | Discharge status: Home / Self Care | Payer: 59 | Source: Ambulatory Visit | Attending: Family Medicine | Admitting: Family Medicine

## 2020-06-23 ENCOUNTER — Other Ambulatory Visit: Payer: Self-pay

## 2020-06-23 DIAGNOSIS — R928 Other abnormal and inconclusive findings on diagnostic imaging of breast: Secondary | ICD-10-CM

## 2021-06-07 ENCOUNTER — Other Ambulatory Visit: Payer: Self-pay | Admitting: Family Medicine

## 2021-06-07 DIAGNOSIS — Z1231 Encounter for screening mammogram for malignant neoplasm of breast: Secondary | ICD-10-CM

## 2021-06-22 ENCOUNTER — Ambulatory Visit
Admission: RE | Admit: 2021-06-22 | Discharge: 2021-06-22 | Disposition: A | Discharge status: Home / Self Care | Payer: 59 | Source: Ambulatory Visit | Attending: Family Medicine | Admitting: Family Medicine

## 2021-06-22 DIAGNOSIS — Z1231 Encounter for screening mammogram for malignant neoplasm of breast: Secondary | ICD-10-CM

## 2022-04-13 ENCOUNTER — Emergency Department (HOSPITAL_COMMUNITY): Payer: 59

## 2022-04-13 ENCOUNTER — Other Ambulatory Visit: Payer: Self-pay

## 2022-04-13 ENCOUNTER — Emergency Department (HOSPITAL_COMMUNITY)
Admission: EM | Admit: 2022-04-13 | Discharge: 2022-04-14 | Disposition: A | Discharge status: Home / Self Care | Payer: 59 | Attending: Emergency Medicine | Admitting: Emergency Medicine

## 2022-04-13 ENCOUNTER — Encounter (HOSPITAL_COMMUNITY): Payer: Self-pay

## 2022-04-13 DIAGNOSIS — R109 Unspecified abdominal pain: Secondary | ICD-10-CM | POA: Diagnosis present

## 2022-04-13 DIAGNOSIS — Z7982 Long term (current) use of aspirin: Secondary | ICD-10-CM | POA: Insufficient documentation

## 2022-04-13 DIAGNOSIS — I10 Essential (primary) hypertension: Secondary | ICD-10-CM | POA: Insufficient documentation

## 2022-04-13 DIAGNOSIS — R102 Pelvic and perineal pain: Secondary | ICD-10-CM | POA: Diagnosis not present

## 2022-04-13 DIAGNOSIS — E119 Type 2 diabetes mellitus without complications: Secondary | ICD-10-CM | POA: Diagnosis not present

## 2022-04-13 LAB — BASIC METABOLIC PANEL
Anion gap: 9 (ref 5–15)
BUN: 21 mg/dL — ABNORMAL HIGH (ref 6–20)
CO2: 24 mmol/L (ref 22–32)
Calcium: 11.3 mg/dL — ABNORMAL HIGH (ref 8.9–10.3)
Chloride: 105 mmol/L (ref 98–111)
Creatinine, Ser: 0.93 mg/dL (ref 0.44–1.00)
GFR, Estimated: >60 mL/min (ref >60)
Glucose, Bld: 254 mg/dL — ABNORMAL HIGH (ref 70–99)
Potassium: 4.2 mmol/L (ref 3.5–5.1)
Sodium: 138 mmol/L (ref 135–145)

## 2022-04-13 LAB — URINALYSIS, ROUTINE W REFLEX MICROSCOPIC
Bilirubin Urine: NEGATIVE
Glucose, UA: >=500 mg/dL — AB
Ketones, ur: NEGATIVE mg/dL
Nitrite: NEGATIVE
Protein, ur: NEGATIVE mg/dL
Specific Gravity, Urine: 1.026 (ref 1.005–1.030)
pH: 5 (ref 5.0–8.0)

## 2022-04-13 LAB — CBC WITH DIFFERENTIAL/PLATELET
Abs Immature Granulocytes: 0.02 10*3/uL (ref 0.00–0.07)
Basophils Absolute: 0.1 10*3/uL (ref 0.0–0.1)
Basophils Relative: 1 %
Eosinophils Absolute: 0.1 10*3/uL (ref 0.0–0.5)
Eosinophils Relative: 2 %
HCT: 42.7 % (ref 36.0–46.0)
Hemoglobin: 14.4 g/dL (ref 12.0–15.0)
Immature Granulocytes: 0 %
Lymphocytes Relative: 26 %
Lymphs Abs: 1.9 10*3/uL (ref 0.7–4.0)
MCH: 28.7 pg (ref 26.0–34.0)
MCHC: 33.7 g/dL (ref 30.0–36.0)
MCV: 85.2 fL (ref 80.0–100.0)
Monocytes Absolute: 0.7 10*3/uL (ref 0.1–1.0)
Monocytes Relative: 9 %
Neutro Abs: 4.7 10*3/uL (ref 1.7–7.7)
Neutrophils Relative %: 62 %
Platelets: 472 10*3/uL — ABNORMAL HIGH (ref 150–400)
RBC: 5.01 MIL/uL (ref 3.87–5.11)
RDW: 12.8 % (ref 11.5–15.5)
WBC: 7.5 10*3/uL (ref 4.0–10.5)
nRBC: 0 % (ref 0.0–0.2)

## 2022-04-13 LAB — HCG, QUANTITATIVE, PREGNANCY: hCG, Beta Chain, Quant, S: 5 m[IU]/mL — ABNORMAL HIGH (ref <5)

## 2022-04-13 LAB — PREGNANCY, URINE: Preg Test, Ur: NEGATIVE

## 2022-04-13 MED ORDER — FENTANYL CITRATE PF 50 MCG/ML IJ SOSY
50.0000 ug | PREFILLED_SYRINGE | Freq: Once | INTRAMUSCULAR | Status: AC
  Administered 2022-04-13: 50 ug via INTRAVENOUS
  Filled 2022-04-13: qty 1

## 2022-04-13 MED ORDER — KETOROLAC TROMETHAMINE 15 MG/ML IJ SOLN
15.0000 mg | Freq: Once | INTRAMUSCULAR | Status: AC
  Administered 2022-04-13: 15 mg via INTRAVENOUS
  Filled 2022-04-13: qty 1

## 2022-04-13 MED ORDER — LACTATED RINGERS IV BOLUS
1000.0000 mL | Freq: Once | INTRAVENOUS | Status: AC
  Administered 2022-04-13: 1000 mL via INTRAVENOUS

## 2022-04-13 NOTE — ED Notes (Signed)
Pt ambulatory without assistance.  

## 2022-04-13 NOTE — ED Notes (Signed)
Pt dressed in gown, ready for ultrasound.

## 2022-04-13 NOTE — ED Provider Triage Note (Cosign Needed)
Emergency Medicine Provider Triage Evaluation Note  Kelly Davies , a 51 y.o. female  was evaluated in triage.  Pt complains of left-sided flank pain.  Patient sent in from urgent care due to microscopic hematuria and flank pain.  She has had several days of left lower back pain which was mild but now sharp and worse with movement.  The pain does not radiate.  She denies abdominal pain fevers or chills and no history of kidney stones..  Review of Systems  Positive:back, pain Negative: menstruation  Physical Exam  BP (!) 183/116 (BP Location: Right Arm)   Pulse (!) 117   Temp 99.4 F (37.4 C)   Resp 16   SpO2 99%  Gen:   Awake, no distress   Resp:  Normal effort  MSK:   Moves extremities without difficulty  Other:  TTP Left low back pain  Medical Decision Making  Medically screening exam initiated at 3:31 PM.  Appropriate orders placed.  KELLY RANIERI was informed that the remainder of the evaluation will be completed by another provider, this initial triage assessment does not replace that evaluation, and the importance of remaining in the ED until their evaluation is complete.    Margarita Mail, PA-C 04/13/22 1538

## 2022-04-13 NOTE — ED Provider Notes (Incomplete)
[facility] DEPT Provider Note   CSN: [id number] Arrival date & time: 04/13/22  1522     History  Chief Complaint  Patient presents with   Flank Pain    Kelly Davies is a 51 y.o. female.   Flank Pain     51 year old female with medical history significant for TIA, DM 2, HTN, HLD, known history of uterine fibroids who presents emergency department with left-sided flank pain.  The patient states that she was seen in urgent care earlier today and advised to present to the emergency department for further evaluation.  She has had several days of flank pain in the left lower back.  She endorses sharp pain that is worse with movement.  The pain does not radiate.  She denies any abdominal pain, nausea, vomiting, fever or chills.  She denies any dysuria.  She had had microscopic hematuria at urgent care earlier today.  She denies any vaginal bleeding or vaginal discharge.  She still menstruates and gets irregular menstrual periods.  Home Medications Prior to Admission medications   Medication Sig Start Date End Date Taking? Authorizing Provider  aspirin 325 MG tablet Take 1 tablet (325 mg total) by mouth daily. 01/17/17   Mariel Aloe, MD  atorvastatin (LIPITOR) 40 MG tablet Take 1 tablet (40 mg total) by mouth daily at 6 PM. 01/17/17   Mariel Aloe, MD  cetirizine (ZYRTEC) 10 MG tablet Take 10 mg daily as needed by mouth for allergies.    [provider]  folic acid (FOLVITE) 1 MG tablet Take by mouth. 06/17/17   [provider]  Iron-FA-B Cmp-C-Biot-Probiotic (FUSION PLUS PO) Take by mouth.    [provider]  LACTOBACILLUS PO Take by mouth. 06/17/17   [provider]  levETIRAcetam (KEPPRA) 500 MG tablet Take 1 tablet (500 mg total) by mouth 2 (two) times daily. Must be seen for further refills. Call [phone] to make an appointment. 02/17/20   Frann Rider, NP  Olopatadine HCl (PAZEO) 0.7 % SOLN Apply 1 drop  daily to eye.    [provider]  pantoprazole (PROTONIX) 40 MG tablet  06/02/17   [provider]  sitaGLIPtin-metformin (JANUMET) 50-500 MG tablet Take 1 tablet by mouth daily.  01/21/17 01/21/18  [provider]      Allergies    Latex    Review of Systems   Review of Systems  Genitourinary:  Positive for flank pain.  All other systems reviewed and are negative.   Physical Exam Updated Vital Signs BP 137/87 (BP Location: Right Arm)   Pulse (!) 109   Temp 98.6 F (37 C) (Oral)   Resp 18   Ht '5\' 2"'$  (1.575 m)   Wt 95.3 kg   SpO2 100%   BMI 38.41 kg/m  Physical Exam  ED Results / Procedures / Treatments   Labs (all labs ordered are listed, but only abnormal results are displayed) Labs Reviewed  BASIC METABOLIC PANEL - Abnormal; Notable for the following components:      Result Value   Glucose, Bld 254 (*)    BUN 21 (*)    Calcium 11.3 (*)    All other components within normal limits  CBC WITH DIFFERENTIAL/PLATELET - Abnormal; Notable for the following components:   Platelets 472 (*)    All other components within normal limits  HCG, QUANTITATIVE, PREGNANCY - Abnormal; Notable for the following components:   hCG, Beta Chain, Quant, S 5 (*)  All other components within normal limits  URINALYSIS, ROUTINE W REFLEX MICROSCOPIC    EKG None  Radiology CT Renal Stone Study  Result Date: 04/13/2022 CLINICAL DATA:  Flank pain, renal stone suspected. EXAM: CT ABDOMEN AND PELVIS WITHOUT CONTRAST TECHNIQUE: Multidetector CT imaging of the abdomen and pelvis was performed following the standard protocol without IV contrast. RADIATION DOSE REDUCTION: This exam was performed according to the departmental dose-optimization program which includes automated exposure control, adjustment of the mA and/or kV according to patient size and/or use of iterative reconstruction technique. COMPARISON:  Pelvic ultrasound October 23, 2017. FINDINGS: Lower chest: No acute  abnormality. Hepatobiliary: No suspicious hepatic lesion. Gallbladder is decompressed. No biliary ductal dilation. Pancreas: No pancreatic ductal dilation or evidence of acute inflammation. Spleen: No splenomegaly. Adrenals/Urinary Tract: Bilateral adrenal glands appear normal. No hydronephrosis. No renal, ureteral or bladder calculi. Urinary bladder is unremarkable for degree of distension. Stomach/Bowel: Small hiatal hernia. No pathologic dilation of small or large bowel. Normal appendix. No evidence of acute bowel inflammation. Vascular/Lymphatic: Normal caliber abdominal aorta. No pathologically enlarged abdominal or pelvic lymph nodes. Reproductive: Enlarged leiomyomatous uterus as seen on prior ultrasound October 23, 2017 no suspicious adnexal mass. Other: No significant abdominopelvic free fluid. Musculoskeletal: No acute osseous abnormality. Mild thoracolumbar spondylosis. Mild degenerative change of the hips. IMPRESSION: 1. No acute abnormality in the abdomen or pelvis. Specifically, no evidence of obstructive uropathy. 2. Enlarged leiomyomatous uterus as seen on prior ultrasound October 23, 2017. 3. Small hiatal hernia. Electronically Signed   By: Dahlia Bailiff M.D.   On: 04/13/2022 16:15    Procedures Procedures    Medications Ordered in ED Medications - No data to display  ED Course/ Medical Decision Making/ A&P                           Medical Decision Making Amount and/or Complexity of Data Reviewed Radiology: ordered.    51 year old female with medical history significant for TIA, DM 2, HTN, HLD, known history of uterine fibroids who presents emergency department with left-sided flank pain.  The patient states that she was seen in urgent care earlier today and advised to present to the emergency department for further evaluation.  She has had several days of flank pain in the left lower back.  She endorses sharp pain that is worse with movement.  The pain does not radiate.  She denies any  abdominal pain, nausea, vomiting, fever or chills.  She denies any dysuria.  She had had microscopic hematuria at urgent care earlier today.  She denies any vaginal bleeding or vaginal discharge.  She still menstruates and gets irregular menstrual periods.  On arrival, the patient was afebrile, tachycardic P117, dynamically stable, initial BP 183/116, subsequent improved to 137/87 on recheck, saturating well on room air.  Physical exam generally unremarkable with an abdomen that was soft, nontender, nondistended no rebound or guarding.  Her evaluation was concerning for moderate hematuria, trace leukocytes in the urine, negative nitrates, rare bacteria, no convincing evidence for UTI, CBC without a leukocytosis or anemia, hCG nonspecifically elevated to 5, BMP with hyperglycemia to 254, creatinine 0.93, calcium 11.3.  A CT abdomen pelvis was performed which revealed evidence of uterine fibroids which could be etiology of the patient's presentation.  Will obtain pelvic ultrasound to evaluate for torsion. IMPRESSION:  1. No acute abnormality in the abdomen or pelvis. Specifically, no  evidence of obstructive uropathy.  2. Enlarged leiomyomatous uterus as seen  on prior ultrasound October 23, 2017.  3. Small hiatal hernia.    ***  {Document critical care time when appropriate:1} {Document review of labs and clinical decision tools ie heart score, Chads2Vasc2 etc:1}  {Document your independent review of radiology images, and any outside records:1} {Document your discussion with family members, caretakers, and with consultants:1} {Document social determinants of health affecting pt's care:1} {Document your decision making why or why not admission, treatments were needed:1} Final Clinical Impression(s) / ED Diagnoses Final diagnoses:  None    Rx / DC Orders ED Discharge Orders     None

## 2022-04-13 NOTE — ED Triage Notes (Signed)
Pt presents with c/o left side flank pain for several days. Pt reports she had some blood in her urine when she went to UC today.

## 2022-04-14 MED ORDER — MELOXICAM 7.5 MG PO TABS: 7.5000 mg | ORAL_TABLET | Freq: Every day | ORAL | 0 refills | Status: AC | PRN

## 2022-04-14 MED ORDER — ONDANSETRON 4 MG PO TBDP: 4.0000 mg | ORAL_TABLET | Freq: Three times a day (TID) | ORAL | 0 refills | Status: AC | PRN

## 2022-04-14 NOTE — ED Provider Notes (Signed)
I assumed care of this patient.  Please see previous provider note for further details of Hx, PE.  Briefly patient is a 51 y.o. female who presented here for pelvic/flank pain. Initial work up reassuring. Pending [place].  [place] notable for fibroid. Otherwise unremarkable.   The patient appears reasonably screened and/or stabilized for discharge and I doubt any other medical condition or other [facility] requiring further screening, evaluation, or treatment in the ED at this time. I have discussed the findings, Dx and Tx plan with the patient/family who expressed understanding and agree(s) with the plan. Discharge instructions discussed at length. The patient/family was given strict return precautions who verbalized understanding of the instructions. No further questions at time of discharge.  Disposition: Discharge  Condition: Good  ED Discharge Orders          Ordered    meloxicam (MOBIC) 7.5 MG tablet  Daily PRN        04/14/22 0218    ondansetron (ZOFRAN-ODT) 4 MG disintegrating tablet  Every 8 hours PRN        04/14/22 0218    Ambulatory referral to Obstetrics / Gynecology        04/13/22 2355             Follow Up: [facility]. Gilmer Leeper [phone] Schedule an appointment as soon as possible for a visit    Fanny Bien, MD [address] [place] [address] [phone]  Call  to schedule an appointment for close follow up        Estella Malatesta, Grayce Sessions, MD 04/14/22 [phone]

## 2022-04-14 NOTE — ED Notes (Signed)
I provided reinforced discharge education based off of after visit summary/care provided. Pt acknowledged and understood my education. Pt had no further questions/concerns for provider/myself.

## 2022-06-05 ENCOUNTER — Telehealth: Payer: Self-pay | Admitting: *Deleted

## 2022-06-05 NOTE — Telephone Encounter (Signed)
Spoke with the patient regarding the referral to GYN oncology. Patient scheduled as new patient with Dr Ernestina Patches on 3/4 at 11:15 am. Patient given an arrival time of 10:45 am. Explained to the patient the the doctor will perform a pelvic exam at this visit. Patient given the policy that only one visitor allowed and that visitor must be over 16 yrs are allowed in the [facility]. Patient given the address/phone number for the clinic and that the center offers free valet service. Patient aware of the mask mandate.

## 2022-06-13 ENCOUNTER — Encounter: Payer: Self-pay | Admitting: Psychiatry

## 2022-06-17 ENCOUNTER — Inpatient Hospital Stay: Payer: 59 | Attending: Psychiatry | Admitting: Psychiatry

## 2022-06-17 ENCOUNTER — Encounter: Payer: Self-pay | Admitting: Psychiatry

## 2022-06-17 ENCOUNTER — Other Ambulatory Visit: Payer: Self-pay

## 2022-06-17 ENCOUNTER — Ambulatory Visit: Payer: 59

## 2022-06-17 ENCOUNTER — Ambulatory Visit (HOSPITAL_BASED_OUTPATIENT_CLINIC_OR_DEPARTMENT_OTHER): Payer: 59 | Admitting: Gynecologic Oncology

## 2022-06-17 VITALS — BP 126/62 | HR 91 | Temp 99.7°F | Resp 14 | Wt 218.7 lb

## 2022-06-17 DIAGNOSIS — Z79899 Other long term (current) drug therapy: Secondary | ICD-10-CM | POA: Diagnosis not present

## 2022-06-17 DIAGNOSIS — Z8673 Personal history of transient ischemic attack (TIA), and cerebral infarction without residual deficits: Secondary | ICD-10-CM | POA: Diagnosis not present

## 2022-06-17 DIAGNOSIS — I10 Essential (primary) hypertension: Secondary | ICD-10-CM | POA: Insufficient documentation

## 2022-06-17 DIAGNOSIS — E119 Type 2 diabetes mellitus without complications: Secondary | ICD-10-CM | POA: Diagnosis not present

## 2022-06-17 DIAGNOSIS — E7800 Pure hypercholesterolemia, unspecified: Secondary | ICD-10-CM | POA: Diagnosis not present

## 2022-06-17 DIAGNOSIS — D259 Leiomyoma of uterus, unspecified: Secondary | ICD-10-CM | POA: Diagnosis not present

## 2022-06-17 DIAGNOSIS — C541 Malignant neoplasm of endometrium: Secondary | ICD-10-CM | POA: Insufficient documentation

## 2022-06-17 DIAGNOSIS — Z79818 Long term (current) use of other agents affecting estrogen receptors and estrogen levels: Secondary | ICD-10-CM | POA: Insufficient documentation

## 2022-06-17 DIAGNOSIS — Z7982 Long term (current) use of aspirin: Secondary | ICD-10-CM | POA: Insufficient documentation

## 2022-06-17 DIAGNOSIS — I5189 Other ill-defined heart diseases: Secondary | ICD-10-CM

## 2022-06-17 DIAGNOSIS — Z7984 Long term (current) use of oral hypoglycemic drugs: Secondary | ICD-10-CM | POA: Insufficient documentation

## 2022-06-17 DIAGNOSIS — Z6841 Body Mass Index (BMI) 40.0 and over, adult: Secondary | ICD-10-CM | POA: Diagnosis not present

## 2022-06-17 DIAGNOSIS — E1169 Type 2 diabetes mellitus with other specified complication: Secondary | ICD-10-CM

## 2022-06-17 NOTE — Patient Instructions (Signed)
Preparing for your Surgery  Plan for surgery on June 25, 2022 with Dr. Bernadene Bell at [facility] will be scheduled for pelvic examination under anesthesia, dilation and curettage of the uterus with mirena intrauterine device placement.   Pre-operative Testing -You will receive a phone call from presurgical testing at [facility] to discuss surgery instructions and arrange for lab work if needed.  -Bring your insurance card, copy of an advanced directive if applicable, medication list.  -You should not be taking blood thinners or aspirin at least ten days prior to surgery unless instructed by your surgeon.  -Do not take supplements such as fish oil (omega 3), red yeast rice, turmeric before your surgery. You want to avoid medications with aspirin in them including headache powders such as BC or Goody's), Excedrin migraine.  Day Before Surgery at [facility] will be advised you can have clear liquids up until 3 hours before your surgery.    Your role in recovery Your role is to become active as soon as directed by your doctor, while still giving yourself time to heal.  Rest when you feel tired. You will be asked to do the following in order to speed your recovery:  - Cough and breathe deeply. This helps to clear and expand your lungs and can prevent pneumonia after surgery.  - [place]. Do mild physical activity. Walking or moving your legs help your circulation and body functions return to normal. Do not try to get up or walk alone the first time after surgery.   -If you develop swelling on one leg or the other, pain in the back of your leg, redness/warmth in one of your legs, please call the office or go to the Emergency Room to have a doppler to rule out a blood clot. For shortness of breath, chest pain-seek care in the Emergency Room as soon as possible. - Actively manage your pain. Managing your pain lets you move in comfort. We will ask you to  rate your pain on a scale of zero to 10. It is your responsibility to tell your doctor or nurse where and how much you hurt so your pain can be treated.  Special Considerations -Your final pathology results from surgery should be available around one week after surgery and the results will be relayed to you when available.  -FMLA forms can be faxed to [phone] and please allow 5-7 business days for completion.  Pain Management After Surgery -Make sure that you have Tylenol and Ibuprofen at home IF [place] to use on a regular basis after surgery for pain control. We recommend alternating the medications every hour to six hours since they work differently and are processed in the body differently for pain relief.  -Review the attached handout on narcotic use and their risks and side effects.   Bowel Regimen -It is important to prevent constipation and drink adequate amounts of liquids.   Risks of Surgery Risks of surgery are low but include bleeding, infection, damage to surrounding structures, re-operation, blood clots, and very rarely death.  AFTER SURGERY INSTRUCTIONS  Return to work:  1-2 days if applicable  Activity: 1. Be up and out of the bed during the day.  Take a nap if needed.  You may walk up steps but be careful and use the hand rail.  Stair climbing will tire you more than you think, you may need to stop part way  and rest.   2. No lifting or straining for 1 week over 10 pounds. No pushing, pulling, straining for 1 week.  3. No driving for minimum 24 hours after surgery.  Do not drive if you are taking narcotic pain medicine and make sure that your reaction time has returned.   4. You can shower as soon as the next day after surgery. Shower daily. No tub baths or submerging your body in water until cleared by your surgeon. If you have the soap that was given to you by pre-surgical testing that was used before surgery, you do not need to use  it afterwards because this can irritate your incisions.   5. No sexual activity and nothing in the vagina for 2 weeks.  6. You may experience vaginal spotting and discharge after surgery.  The spotting is normal but if you experience heavy bleeding, call our office.  7. Take Tylenol or ibuprofen first for pain if you are able to take these medications.  Monitor your Tylenol intake to a max of 4,000 mg in a 24 hour period. You can alternate these medications after surgery.  Diet: 1. Low sodium Heart Healthy Diet is recommended but you are cleared to resume your normal (before surgery) diet after your procedure.  2. It is safe to use a laxative, such as Miralax or Colace, if you have difficulty moving your bowels.   Wound Care: 1. Keep clean and dry.  Shower daily.  Reasons to call the Doctor: Fever - Oral temperature greater than 100.4 degrees Fahrenheit Foul-smelling vaginal discharge Difficulty urinating Nausea and vomiting Increased pain at the site of the incision that is unrelieved with pain medicine. Difficulty breathing with or without chest pain New calf pain especially if only on one side Sudden, continuing increased vaginal bleeding with or without clots.   Contacts: For questions or concerns you should contact:  Dr. Bernadene Bell at [facility], NP at [phone]  After Hours: call [phone] and have the GYN Oncologist paged/contacted (after 5 pm or on the weekends).  Messages sent via mychart are for non-urgent matters and are not responded to after hours so for urgent needs, please call the after hours number.

## 2022-06-17 NOTE — Patient Instructions (Signed)
Preparing for your Surgery   Plan for surgery on June 25, 2022 with Dr. Bernadene Bell at [facility] will be scheduled for pelvic examination under anesthesia, dilation and curettage of the uterus with mirena intrauterine device placement.    Pre-operative Testing -You will receive a phone call from presurgical testing at [facility] to discuss surgery instructions and arrange for lab work if needed.   -Bring your insurance card, copy of an advanced directive if applicable, medication list.   -You should not be taking blood thinners or aspirin at least ten days prior to surgery unless instructed by your surgeon.   -Do not take supplements such as fish oil (omega 3), red yeast rice, turmeric before your surgery. You want to avoid medications with aspirin in them including headache powders such as BC or Goody's), Excedrin migraine.   Day Before Surgery at [facility] will be advised you can have clear liquids up until 3 hours before your surgery.     Your role in recovery Your role is to become active as soon as directed by your doctor, while still giving yourself time to heal.  Rest when you feel tired. You will be asked to do the following in order to speed your recovery:   - Cough and breathe deeply. This helps to clear and expand your lungs and can prevent pneumonia after surgery.  - [place]. Do mild physical activity. Walking or moving your legs help your circulation and body functions return to normal. Do not try to get up or walk alone the first time after surgery.   -If you develop swelling on one leg or the other, pain in the back of your leg, redness/warmth in one of your legs, please call the office or go to the Emergency Room to have a doppler to rule out a blood clot. For shortness of breath, chest pain-seek care in the Emergency Room as soon as possible. - Actively manage your pain. Managing your pain lets you move in comfort. We will  ask you to rate your pain on a scale of zero to 10. It is your responsibility to tell your doctor or nurse where and how much you hurt so your pain can be treated.   Special Considerations -Your final pathology results from surgery should be available around one week after surgery and the results will be relayed to you when available.   -FMLA forms can be faxed to [phone] and please allow 5-7 business days for completion.   Pain Management After Surgery -Make sure that you have Tylenol and Ibuprofen at home IF [place] to use on a regular basis after surgery for pain control. We recommend alternating the medications every hour to six hours since they work differently and are processed in the body differently for pain relief.   -Review the attached handout on narcotic use and their risks and side effects.    Bowel Regimen -It is important to prevent constipation and drink adequate amounts of liquids.    Risks of Surgery Risks of surgery are low but include bleeding, infection, damage to surrounding structures, re-operation, blood clots, and very rarely death.   AFTER SURGERY INSTRUCTIONS   Return to work:  1-2 days if applicable   Activity: 1. Be up and out of the bed during the day.  Take a nap if needed.  You may walk up steps but be careful and use the hand rail.  Stair climbing will tire you more than you think, you may need to stop part way and rest.    2. No lifting or straining for 1 week over 10 pounds. No pushing, pulling, straining for 1 week.   3. No driving for minimum 24 hours after surgery.  Do not drive if you are taking narcotic pain medicine and make sure that your reaction time has returned.    4. You can shower as soon as the next day after surgery. Shower daily. No tub baths or submerging your body in water until cleared by your surgeon. If you have the soap that was given to you by pre-surgical testing that was used before surgery,  you do not need to use it afterwards because this can irritate your incisions.    5. No sexual activity and nothing in the vagina for 2 weeks.   6. You may experience vaginal spotting and discharge after surgery.  The spotting is normal but if you experience heavy bleeding, call our office.   7. Take Tylenol or ibuprofen first for pain if you are able to take these medications.  Monitor your Tylenol intake to a max of 4,000 mg in a 24 hour period. You can alternate these medications after surgery.   Diet: 1. Low sodium Heart Healthy Diet is recommended but you are cleared to resume your normal (before surgery) diet after your procedure.   2. It is safe to use a laxative, such as Miralax or Colace, if you have difficulty moving your bowels.    Wound Care: 1. Keep clean and dry.  Shower daily.   Reasons to call the Doctor: Fever - Oral temperature greater than 100.4 degrees Fahrenheit Foul-smelling vaginal discharge Difficulty urinating Nausea and vomiting Increased pain at the site of the incision that is unrelieved with pain medicine. Difficulty breathing with or without chest pain New calf pain especially if only on one side Sudden, continuing increased vaginal bleeding with or without clots.   Contacts: For questions or concerns you should contact:   Dr. Bernadene Bell at [place], NP at [phone]   After Hours: call [phone] and have the GYN Oncologist paged/contacted (after 5 pm or on the weekends).   Messages sent via mychart are for non-urgent matters and are not responded to after hours so for urgent needs, please call the after hours number.

## 2022-06-17 NOTE — Progress Notes (Signed)
GYNECOLOGIC ONCOLOGY NEW PATIENT CONSULTATION  Date of Service: 06/17/2022 Referring Provider: Azucena Fallen, MD   ASSESSMENT AND PLAN: Kelly Davies is a 52 y.o. woman with FIGO grade 1 endometrial cancer as well as uterine fibroids and poorly controlled T2DM, history of stroke in 2018.  We reviewed the nature of endometrial cancer and its recommended surgical staging, including total hysterectomy, bilateral salpingo-oophorectomy, and lymph node assessment.  However, given patient's poorly controlled diabetes, and given size uterine fibroids that would require a laparotomy, patient is not currently an appropriate candidate for surgery. Medical management options are recommended for disease stabilization and reduction of risk of progression of cancer.  We reviewed the role of progesterone therapy. We reviewed the 3 most studied options, to include levonorgesterol IUD (56mg/d), oral medorxyprogesterone acetate '10mg'$  daily or cyclically [id number] per month, or oral megesterol acetate 40-'200mg'$  per day.    Kelly Davies understands that all options have few side effects. Local progesterone through IUD may have a stronger effect on the endometrium with less systemic side effects.  Furthermore, studies demonstrate that 80-90% of women will have regression of their hyperplasia with progesterone use.    We have discussed proceeding with a dilation and curettage, LNG IUD insertion. We would then have her discontinue her megace and only add back if needed. Reviewed megace can contribute to appetite stimulation. Reviewed risk of IUD expulsion, particularly in the setting of fibroids. Would recommend monitoring with EMB in clinic in 3 months and reassess at that time if improved glucose control which would make her a candidate to proceed with surgery at that time. Patient is in agreement with plan.  Patient was consented for: dilation and curettage, levonorgestrel intrauterine device insertion on 06/25/22.  The  risks of surgery were discussed in detail and Kelly Davies understands these to including but not limited to bleeding requiring a blood transfusion, infection, injury to adjacent organs (including but not limited to the bladder/rectum/nearby vessels/nerves if uterine perforation occurred), unforseen complication, and possible need for re-exploration.  If the patient experiences any of these events, Kelly Davies understands that her hospitalization or recovery may be prolonged and that Kelly Davies may need to take additional medications for a prolonged period. The patient will receive DVT and antibiotic prophylaxis as indicated. Kelly Davies voiced a clear understanding. Kelly Davies had the opportunity to ask questions and informed consent was obtained today. Kelly Davies wishes to proceed.  Kelly Davies does not require preoperative clearance. Her METs are >4.  Okay with pt continue with aspirin. All preoperative instructions were reviewed. Postoperative expectations were also reviewed.   A copy of this note was sent to the patient's referring provider.  MBernadene Bell MD Gynecologic Oncology   Medical Decision Making I personally spent  TOTAL 50 minutes face-to-face and non-face-to-face in the care of this patient, which includes all pre, intra, and post visit time on the date of service.   ------------  CC: Endometrial cancer, uterine fibroids  HISTORY OF PRESENT ILLNESS:  Kelly BERNHEISELis a 52y.o. woman who is seen in consultation at the request of VAzucena Fallen MD for evaluation of FIGO grade 1 endometrial cancer.  Patient presents OB/GYN on 1/30/4.  Kelly Davies reports a history of fibroids and notes painful menses as well as pelvic pressure.  Kelly Davies has noticed hot flashes and night sweats in the past few months.  Menstrual cycles are starting to space with skipping menses and some spotting.  Patient also has a history of poorly controlled type 2 diabetes with her last A1c of  10.  Kelly Davies represented for endometrial biopsy on 05/28/2022 which returned  with FIGO grade 1 endometrial adenocarcinoma.  On a pelvic ultrasound on 04/14/2022 her uterus was measured at 12.8 x 6.9 x 7.3 cm with a 5.5 cm fundal fibroid.  Today patient presents with her mom.  Her last A1c was 11 in December 2023.  Kelly Davies reports that Kelly Davies has been working with her endocrinologist, Alanson Aly, MD, improving her blood sugars.  Kelly Davies has continuous glucose monitoring with libre.  On reviewing her results from her phone, it appears that since some adjustments were made on March 1 Kelly Davies has had some improvement in her morning sugars now in the 90s to 110s.  Her postprandials are now in the 180s and sometimes up to 250.  Patient also has a history of a stroke in 2018.  Kelly Davies had left arm weakness at that time but has no residual effects.  On review of chart, Kelly Davies had an echo done with normal systolic ejection fraction and grade 1 diastolic heart failure noted.  Kelly Davies had carotid artery duplex ultrasounds which noted a 1 to 39% stenosis.  Kelly Davies reports early satiety over the past couple months which Kelly Davies attributes to possibly some of her medication changes.  Kelly Davies otherwise denies abdominal bloating, significant weight loss, change in bowel or bladder habits.  Kelly Davies reports that her OB/GYN put her on Megace starting about 3 weeks ago.   PAST MEDICAL HISTORY: Past Medical History:  Diagnosis Date   Diabetes mellitus without complication ([place])    Hypercholesteremia    Hypertension    Partial seizure ([place])    Stroke ([place])    TIA (transient ischemic attack)     PAST SURGICAL HISTORY: Past Surgical History:  Procedure Laterality Date   TEE WITHOUT CARDIOVERSION N/A 03/20/2017   Procedure: TRANSESOPHAGEAL ECHOCARDIOGRAM (TEE);  Surgeon: Adrian Prows, MD;  Location: [facility] ENDOSCOPY;  Service: Cardiovascular;  Laterality: N/A;    OB/GYN HISTORY: OB History  Gravida Para Term Preterm AB Living  0 0 0 0 0 0  SAB IAB Ectopic Multiple Live Births  0 0 0 0 0      Age at menarche: 56 or 62 Age at  menopause: N/A Hx of HRT: None Hx of STI: No Last pap: 01/13/21 WNL pt pt History of abnormal pap smears: Denies  SCREENING STUDIES:  Last mammogram: 07/2021 Last colonoscopy: 2019  MEDICATIONS:  Current Outpatient Medications:    aspirin 325 MG tablet, Take 1 tablet (325 mg total) by mouth daily., Disp: 30 tablet, Rfl: 0   atorvastatin (LIPITOR) 40 MG tablet, Take 1 tablet (40 mg total) by mouth daily at 6 PM., Disp: 30 tablet, Rfl: 0   cetirizine (ZYRTEC) 10 MG tablet, Take 10 mg daily as needed by mouth for allergies., Disp: , Rfl:    folic acid (FOLVITE) 1 MG tablet, Take by mouth., Disp: , Rfl:    glimepiride (AMARYL) 4 MG tablet, 1 tablet with breakfast or the first main meal of the day Orally Once a day for 30 days, Disp: , Rfl:    Iron-FA-B Cmp-C-Biot-Probiotic (FUSION PLUS PO), Take by mouth., Disp: , Rfl:    JANUVIA 100 MG tablet, 1 tablet by mouth Once a day for 30 days, Disp: , Rfl:    levETIRAcetam (KEPPRA) 500 MG tablet, Take 1 tablet (500 mg total) by mouth 2 (two) times daily. Must be seen for further refills. Call [phone] to make an appointment., Disp: 60 tablet, Rfl: 0   megestrol (MEGACE) 40  MG tablet, Take 40 mg by mouth 2 (two) times daily., Disp: , Rfl:    Olopatadine HCl (PAZEO) 0.7 % SOLN, Apply 1 drop daily to eye., Disp: , Rfl:    pantoprazole (PROTONIX) 40 MG tablet, , Disp: , Rfl: 1   Cholecalciferol (VITAMIN D-3) 25 MCG (1000 UT) CAPS, 1 capsule Orally Once a day, Disp: , Rfl:    Continuous Blood Gluc Sensor (FREESTYLE LIBRE 14 DAY SENSOR) MISC, Use 1 sensor every 14 days, Disp: , Rfl:    rosuvastatin (CRESTOR) 10 MG tablet, 1 tablet Orally Once a day, Disp: , Rfl:   ALLERGIES: Allergies  Allergen Reactions   Latex Hives    FAMILY HISTORY: Family History  Problem Relation Age of Onset   Colon cancer Maternal Uncle        72s   Endometrial cancer Neg Hx    Breast cancer Neg Hx    Ovarian cancer Neg Hx     SOCIAL HISTORY: Social History    Socioeconomic History   Marital status: Single    Spouse name: Not on file   Number of children: Not on file   Years of education: Not on file   Highest education level: Not on file  Occupational History   Not on file  Tobacco Use   Smoking status: Never   Smokeless tobacco: Never  Substance and Sexual Activity   Alcohol use: No   Drug use: No   Sexual activity: Not Currently  Other Topics Concern   Not on file  Social History Narrative   Not on file   Social Determinants of Health   Financial Resource Strain: Not on file  Food Insecurity: No Food Insecurity (06/13/2022)   Hunger Vital Sign    Worried About Running Out of Food in the Last Year: Never true    Ran Out of Food in the Last Year: Never true  Transportation Needs: No Transportation Needs (06/13/2022)   PRAPARE - Hydrologist (Medical): No    Lack of Transportation (Non-Medical): No  Physical Activity: Not on file  Stress: Not on file  Social Connections: Not on file  Intimate Partner Violence: Not At Risk (06/13/2022)   Humiliation, Afraid, Rape, and Kick questionnaire    Fear of Current or Ex-Partner: No    Emotionally Abused: No    Physically Abused: No    Sexually Abused: No    REVIEW OF SYSTEMS: New patient intake form was reviewed.  Complete 10-system review is negative except for the following: Menstrual problems, back pain  PHYSICAL EXAM: BP 126/62 (BP Location: Left Arm, Patient Position: Sitting)   Pulse 91   Temp 99.7 F (37.6 C) (Oral)   Resp 14   Wt 218 lb 11.2 oz (99.2 kg)   SpO2 100%   BMI 40.00 kg/m  Constitutional: No acute distress. Neuro/Psych: Alert, oriented.  Head and Neck: Normocephalic, atraumatic. Neck symmetric without masses. Sclera anicteric.  Respiratory: Normal work of breathing. Clear to auscultation bilaterally. Cardiovascular: Regular rate and rhythm, no murmurs, rubs, or gallops. Abdomen: Normoactive bowel sounds. Soft, non-distended,  non-tender to palpation. Fibroids palpated to 1-2 fingerbreadths above the umbilicus in midline and smaller pedunculated fibroid extends further cephalad in left upper quadrant. Extremities: Grossly normal range of motion. Warm, well perfused. No edema bilaterally. Skin: No rashes or lesions. Lymphatic: No cervical, supraclavicular, or inguinal adenopathy. Genitourinary: External genitalia without lesions. Urethral meatus without lesions or prolapse. On speculum exam, vagina and cervix without lesions. Cervix  pulled cephalad. Bimanual exam reveals normal cervix, pulled cephalad. Enlarged uterus that is wide and not mobile. Rectovaginal exam confirms the above findings and reveals normal sphincter tone and no masses or nodularity. Exam chaperoned by Joylene John, NP   LABORATORY AND RADIOLOGIC DATA: Outside medical records were reviewed to synthesize the above history, along with the history and physical obtained during the visit.  Outside laboratory, pathology, and imaging reports were reviewed, with pertinent results below.  I personally reviewed the outside images.  WBC  Date Value Ref Range Status  04/13/2022 7.5 4.0 - 10.5 K/uL Final   Hemoglobin  Date Value Ref Range Status  04/13/2022 14.4 12.0 - 15.0 g/dL Final   HCT  Date Value Ref Range Status  04/13/2022 42.7 36.0 - 46.0 % Final   Platelets  Date Value Ref Range Status  04/13/2022 472 (H) 150 - 400 K/uL Final   Creatinine, Ser  Date Value Ref Range Status  04/13/2022 0.93 0.44 - 1.00 mg/dL Final   AST  Date Value Ref Range Status  01/28/2017 18 15 - 41 U/L Final   ALT  Date Value Ref Range Status  01/28/2017 13 (L) 14 - 54 U/L Final   A1c (04/12/2022): 11  Surgical pathology (05/28/2022): Endometrial biopsy: Well-differentiated endometrial adenocarcinoma (FIGO 1)  US PELVIC COMPLETE WITH TRANSVAGINAL 04/14/2022  Narrative CLINICAL DATA:  Pelvic pain.  EXAM: TRANSABDOMINAL AND TRANSVAGINAL ULTRASOUND OF  PELVIS  TECHNIQUE: Both transabdominal and transvaginal ultrasound examinations of the pelvis were performed. Transabdominal technique was performed for global imaging of the pelvis including uterus, ovaries, adnexal regions, and pelvic cul-de-sac. It was necessary to proceed with endovaginal exam following the transabdominal exam to visualize the uterus, endometrium, bilateral ovaries and bilateral adnexa.  COMPARISON:  April 25, 2017  FINDINGS: Uterus  Measurements: 12.8 cm x 6.9 cm x 7.3 cm = volume: 339.2 mL. A 5.5 cm x 4.5 cm x 5.1 cm heterogeneous uterine fibroid is seen within the uterine fundus. This is seen on the prior study. Subsequently limited visualization of the endometrium is noted.  Endometrium  Thickness: N/A.  Poorly visualized.  Right ovary  The right ovary is not visualized secondary to overlying bowel gas.  Left ovary  The left ovary is not visualized secondary to overlying bowel gas.  Other findings  There is a small amount of pelvic free fluid.  IMPRESSION: Heterogeneous uterine fibroid with subsequently limited evaluation of the endometrium.   Electronically Signed By: Virgina Norfolk M.D. On: 04/14/2022 00:38

## 2022-06-17 NOTE — Progress Notes (Signed)
Patient here for new patient consultation with Dr. Ernestina Patches and for a pre-operative appointment prior to her scheduled surgery on June 25, 2022. She is scheduled for pelvic examination under anesthesia, dilation and curettage of the uterus with mirena intrauterine device placement. The surgery was discussed in detail.  See after visit summary for additional details. Visual aids used to discuss items related to surgery.     Discussed post-op pain management in detail including the aspects of the enhanced recovery pathway. We discussed the use of tylenol post-op and to monitor for a maximum of 4,000 mg in a 24 hour period. Discussed bowel regimen and advised to make efforts to prevent constipation.     Discussed the use of SCDs and measures to take at home to prevent DVT including frequent mobility.  Reportable signs and symptoms of DVT discussed. Post-operative instructions discussed and expectations for after surgery.     5 minutes spent with the patient.  Verbalizing understanding of material discussed. No needs or concerns voiced at the end of the visit.   Advised patient to call for any needs.    This appointment is included in the global surgical bundle as pre-operative teaching and has no charge.

## 2022-06-17 NOTE — H&P (View-Only) (Signed)
GYNECOLOGIC ONCOLOGY NEW PATIENT CONSULTATION  Date of Service: 06/17/2022 Referring Provider: Vaishali Mody, MD   ASSESSMENT AND PLAN: Kelly Davies is a 51 y.o. woman with FIGO grade 1 endometrial cancer as well as uterine fibroids and poorly controlled T2DM, history of stroke in 2018.  We reviewed the nature of endometrial cancer and its recommended surgical staging, including total hysterectomy, bilateral salpingo-oophorectomy, and lymph node assessment.  However, given patient's poorly controlled diabetes, and given size uterine fibroids that would require a laparotomy, patient is not currently an appropriate candidate for surgery. Medical management options are recommended for disease stabilization and reduction of risk of progression of cancer.  We reviewed the role of progesterone therapy. We reviewed the 3 most studied options, to include levonorgesterol IUD (20mcg/d), oral medorxyprogesterone acetate 10mg daily or cyclically 12-14 days per month, or oral megesterol acetate 40-200mg per day.    She understands that all options have few side effects. Local progesterone through IUD may have a stronger effect on the endometrium with less systemic side effects.  Furthermore, studies demonstrate that 80-90% of women will have regression of their hyperplasia with progesterone use.    We have discussed proceeding with a dilation and curettage, LNG IUD insertion. We would then have her discontinue her megace and only add back if needed. Reviewed megace can contribute to appetite stimulation. Reviewed risk of IUD expulsion, particularly in the setting of fibroids. Would recommend monitoring with EMB in clinic in 3 months and reassess at that time if improved glucose control which would make her a candidate to proceed with surgery at that time. Patient is in agreement with plan.  Patient was consented for: dilation and curettage, levonorgestrel intrauterine device insertion on 06/25/22.  The  risks of surgery were discussed in detail and she understands these to including but not limited to bleeding requiring a blood transfusion, infection, injury to adjacent organs (including but not limited to the bladder/rectum/nearby vessels/nerves if uterine perforation occurred), unforseen complication, and possible need for re-exploration.  If the patient experiences any of these events, she understands that her hospitalization or recovery may be prolonged and that she may need to take additional medications for a prolonged period. The patient will receive DVT and antibiotic prophylaxis as indicated. She voiced a clear understanding. She had the opportunity to ask questions and informed consent was obtained today. She wishes to proceed.  She does not require preoperative clearance. Her METs are >4.  Okay with pt continue with aspirin. All preoperative instructions were reviewed. Postoperative expectations were also reviewed.   A copy of this note was sent to the patient's referring provider.  Izabela Ow, MD Gynecologic Oncology   Medical Decision Making I personally spent  TOTAL 50 minutes face-to-face and non-face-to-face in the care of this patient, which includes all pre, intra, and post visit time on the date of service.   ------------  CC: Endometrial cancer, uterine fibroids  HISTORY OF PRESENT ILLNESS:  Kelly Davies is a 51 y.o. woman who is seen in consultation at the request of Vaishali Mody, MD for evaluation of FIGO grade 1 endometrial cancer.  Patient presents OB/GYN on 1/30/4.  She reports a history of fibroids and notes painful menses as well as pelvic pressure.  She has noticed hot flashes and night sweats in the past few months.  Menstrual cycles are starting to space with skipping menses and some spotting.  Patient also has a history of poorly controlled type 2 diabetes with her last A1c of   10.  She represented for endometrial biopsy on 05/28/2022 which returned  with FIGO grade 1 endometrial adenocarcinoma.  On a pelvic ultrasound on 04/14/2022 her uterus was measured at 12.8 x 6.9 x 7.3 cm with a 5.5 cm fundal fibroid.  Today patient presents with her mom.  Her last A1c was 11 in December 2023.  She reports that she has been working with her endocrinologist, Monica Doerr, MD, improving her blood sugars.  She has continuous glucose monitoring with libre.  On reviewing her results from her phone, it appears that since some adjustments were made on March 1 she has had some improvement in her morning sugars now in the 90s to 110s.  Her postprandials are now in the 180s and sometimes up to 250.  Patient also has a history of a stroke in 2018.  She had left arm weakness at that time but has no residual effects.  On review of chart, she had an echo done with normal systolic ejection fraction and grade 1 diastolic heart failure noted.  She had carotid artery duplex ultrasounds which noted a 1 to 39% stenosis.  She reports early satiety over the past couple months which she attributes to possibly some of her medication changes.  She otherwise denies abdominal bloating, significant weight loss, change in bowel or bladder habits.  She reports that her OB/GYN put her on Megace starting about 3 weeks ago.   PAST MEDICAL HISTORY: Past Medical History:  Diagnosis Date   Diabetes mellitus without complication (HCC)    Hypercholesteremia    Hypertension    Partial seizure (HCC)    Stroke (HCC)    TIA (transient ischemic attack)     PAST SURGICAL HISTORY: Past Surgical History:  Procedure Laterality Date   TEE WITHOUT CARDIOVERSION N/A 03/20/2017   Procedure: TRANSESOPHAGEAL ECHOCARDIOGRAM (TEE);  Surgeon: Ganji, Jay, MD;  Location: [facility];  Service: Cardiovascular;  Laterality: N/A;    OB/GYN HISTORY: OB History  Gravida Para Term Preterm AB Living  0 0 0 0 0 0  SAB IAB Ectopic Multiple Live Births  0 0 0 0 0      Age at menarche: 9 or 10 Age at  menopause: N/A Hx of HRT: None Hx of STI: No Last pap: 01/13/21 WNL pt pt History of abnormal pap smears: Denies  SCREENING STUDIES:  Last mammogram: 07/2021 Last colonoscopy: 2019  MEDICATIONS:  Current Outpatient Medications:    aspirin 325 MG tablet, Take 1 tablet (325 mg total) by mouth daily., Disp: 30 tablet, Rfl: 0   atorvastatin (LIPITOR) 40 MG tablet, Take 1 tablet (40 mg total) by mouth daily at 6 PM., Disp: 30 tablet, Rfl: 0   cetirizine (ZYRTEC) 10 MG tablet, Take 10 mg daily as needed by mouth for allergies., Disp: , Rfl:    folic acid (FOLVITE) 1 MG tablet, Take by mouth., Disp: , Rfl:    glimepiride (AMARYL) 4 MG tablet, 1 tablet with breakfast or the first main meal of the day Orally Once a day for 30 days, Disp: , Rfl:    Iron-FA-B Cmp-C-Biot-Probiotic (FUSION PLUS PO), Take by mouth., Disp: , Rfl:    JANUVIA 100 MG tablet, 1 tablet by mouth Once a day for 30 days, Disp: , Rfl:    levETIRAcetam (KEPPRA) 500 MG tablet, Take 1 tablet (500 mg total) by mouth 2 (two) times daily. Must be seen for further refills. Call [phone] to make an appointment., Disp: 60 tablet, Rfl: 0   megestrol (MEGACE) 40   MG tablet, Take 40 mg by mouth 2 (two) times daily., Disp: , Rfl:    Olopatadine HCl (PAZEO) 0.7 % SOLN, Apply 1 drop daily to eye., Disp: , Rfl:    pantoprazole (PROTONIX) 40 MG tablet, , Disp: , Rfl: 1   Cholecalciferol (VITAMIN D-3) 25 MCG (1000 UT) CAPS, 1 capsule Orally Once a day, Disp: , Rfl:    Continuous Blood Gluc Sensor (FREESTYLE LIBRE 14 DAY SENSOR) MISC, Use 1 sensor every 14 days, Disp: , Rfl:    rosuvastatin (CRESTOR) 10 MG tablet, 1 tablet Orally Once a day, Disp: , Rfl:   ALLERGIES: Allergies  Allergen Reactions   Latex Hives    FAMILY HISTORY: Family History  Problem Relation Age of Onset   Colon cancer Maternal Uncle        60s   Endometrial cancer Neg Hx    Breast cancer Neg Hx    Ovarian cancer Neg Hx     SOCIAL HISTORY: Social History    Socioeconomic History   Marital status: Single    Spouse name: Not on file   Number of children: Not on file   Years of education: Not on file   Highest education level: Not on file  Occupational History   Not on file  Tobacco Use   Smoking status: Never   Smokeless tobacco: Never  Substance and Sexual Activity   Alcohol use: No   Drug use: No   Sexual activity: Not Currently  Other Topics Concern   Not on file  Social History Narrative   Not on file   Social Determinants of Health   Financial Resource Strain: Not on file  Food Insecurity: No Food Insecurity (06/13/2022)   Hunger Vital Sign    Worried About Running Out of Food in the Last Year: Never true    Ran Out of Food in the Last Year: Never true  Transportation Needs: No Transportation Needs (06/13/2022)   PRAPARE - Transportation    Lack of Transportation (Medical): No    Lack of Transportation (Non-Medical): No  Physical Activity: Not on file  Stress: Not on file  Social Connections: Not on file  Intimate Partner Violence: Not At Risk (06/13/2022)   Humiliation, Afraid, Rape, and Kick questionnaire    Fear of Current or Ex-Partner: No    Emotionally Abused: No    Physically Abused: No    Sexually Abused: No    REVIEW OF SYSTEMS: New patient intake form was reviewed.  Complete 10-system review is negative except for the following: Menstrual problems, back pain  PHYSICAL EXAM: BP 126/62 (BP Location: Left Arm, Patient Position: Sitting)   Pulse 91   Temp 99.7 F (37.6 C) (Oral)   Resp 14   Wt 218 lb 11.2 oz (99.2 kg)   SpO2 100%   BMI 40.00 kg/m  Constitutional: No acute distress. Neuro/Psych: Alert, oriented.  Head and Neck: Normocephalic, atraumatic. Neck symmetric without masses. Sclera anicteric.  Respiratory: Normal work of breathing. Clear to auscultation bilaterally. Cardiovascular: Regular rate and rhythm, no murmurs, rubs, or gallops. Abdomen: Normoactive bowel sounds. Soft, non-distended,  non-tender to palpation. Fibroids palpated to 1-2 fingerbreadths above the umbilicus in midline and smaller pedunculated fibroid extends further cephalad in left upper quadrant. Extremities: Grossly normal range of motion. Warm, well perfused. No edema bilaterally. Skin: No rashes or lesions. Lymphatic: No cervical, supraclavicular, or inguinal adenopathy. Genitourinary: External genitalia without lesions. Urethral meatus without lesions or prolapse. On speculum exam, vagina and cervix without lesions. Cervix   pulled cephalad. Bimanual exam reveals normal cervix, pulled cephalad. Enlarged uterus that is wide and not mobile. Rectovaginal exam confirms the above findings and reveals normal sphincter tone and no masses or nodularity. Exam chaperoned by Melissa Cross, NP   LABORATORY AND RADIOLOGIC DATA: Outside medical records were reviewed to synthesize the above history, along with the history and physical obtained during the visit.  Outside laboratory, pathology, and imaging reports were reviewed, with pertinent results below.  I personally reviewed the outside images.  WBC  Date Value Ref Range Status  04/13/2022 7.5 4.0 - 10.5 K/uL Final   Hemoglobin  Date Value Ref Range Status  04/13/2022 14.4 12.0 - 15.0 g/dL Final   HCT  Date Value Ref Range Status  04/13/2022 42.7 36.0 - 46.0 % Final   Platelets  Date Value Ref Range Status  04/13/2022 472 (H) 150 - 400 K/uL Final   Creatinine, Ser  Date Value Ref Range Status  04/13/2022 0.93 0.44 - 1.00 mg/dL Final   AST  Date Value Ref Range Status  01/28/2017 18 15 - 41 U/L Final   ALT  Date Value Ref Range Status  01/28/2017 13 (L) 14 - 54 U/L Final   A1c (04/12/2022): 11  Surgical pathology (05/28/2022): Endometrial biopsy: Well-differentiated endometrial adenocarcinoma (FIGO 1)  US PELVIC COMPLETE WITH TRANSVAGINAL 04/14/2022  Narrative CLINICAL DATA:  Pelvic pain.  EXAM: TRANSABDOMINAL AND TRANSVAGINAL ULTRASOUND OF  PELVIS  TECHNIQUE: Both transabdominal and transvaginal ultrasound examinations of the pelvis were performed. Transabdominal technique was performed for global imaging of the pelvis including uterus, ovaries, adnexal regions, and pelvic cul-de-sac. It was necessary to proceed with endovaginal exam following the transabdominal exam to visualize the uterus, endometrium, bilateral ovaries and bilateral adnexa.  COMPARISON:  April 25, 2017  FINDINGS: Uterus  Measurements: 12.8 cm x 6.9 cm x 7.3 cm = volume: 339.2 mL. A 5.5 cm x 4.5 cm x 5.1 cm heterogeneous uterine fibroid is seen within the uterine fundus. This is seen on the prior study. Subsequently limited visualization of the endometrium is noted.  Endometrium  Thickness: N/A.  Poorly visualized.  Right ovary  The right ovary is not visualized secondary to overlying bowel gas.  Left ovary  The left ovary is not visualized secondary to overlying bowel gas.  Other findings  There is a small amount of pelvic free fluid.  IMPRESSION: Heterogeneous uterine fibroid with subsequently limited evaluation of the endometrium.   Electronically Signed By: Thaddeus  Houston M.D. On: 04/14/2022 00:38  

## 2022-06-19 NOTE — Patient Instructions (Signed)
DUE TO COVID-19 ONLY TWO VISITORS  (aged 52 and older)  ARE ALLOWED TO COME WITH YOU AND STAY IN THE WAITING ROOM ONLY DURING PRE OP AND PROCEDURE.   **NO VISITORS ARE ALLOWED IN THE SHORT STAY AREA OR RECOVERY ROOM!!**  IF YOU WILL BE ADMITTED INTO THE HOSPITAL YOU ARE ALLOWED ONLY FOUR SUPPORT PEOPLE DURING VISITATION HOURS ONLY (7 AM -8PM)   The support person(s) must pass our screening, gel in and out, and wear a mask at all times, including in the patient's room. Patients must also wear a mask when staff or their support person are in the room. Visitors GUEST BADGE MUST BE WORN VISIBLY  One adult visitor may remain with you overnight and MUST be in the room by 8 P.M.     Your procedure is scheduled on: 06/25/22   Report to [facility] Main Entrance    Report to admitting at : 7:45 AM   Call this number if you have problems the morning of surgery [phone]   Do not eat food :After Midnight.   After Midnight you may have the following liquids until : 7:00 AM DAY OF SURGERY  Water Black Coffee (sugar ok, NO MILK/CREAM OR CREAMERS)  Tea (sugar ok, NO MILK/CREAM OR CREAMERS) regular and decaf                             Plain Jell-O (NO RED)                                           Fruit ices (not with fruit pulp, NO RED)                                     Popsicles (NO RED)                                                                  Juice: apple, WHITE grape, WHITE cranberry Sports drinks like Gatorade (NO RED)          Oral Hygiene is also important to reduce your risk of infection.                                    Remember - BRUSH YOUR TEETH THE MORNING OF SURGERY WITH YOUR REGULAR TOOTHPASTE  DENTURES WILL BE REMOVED PRIOR TO SURGERY PLEASE DO NOT APPLY "Poly grip" OR ADHESIVES!!!   Do NOT smoke after Midnight   Take these medicines the morning of surgery with A SIP OF WATER: levetiracetam (kepra),cetirizine.  How to Manage Your Diabetes Before and  After Surgery  Why is it important to control my blood sugar before and after surgery? Improving blood sugar levels before and after surgery helps healing and can limit problems. A way of improving blood sugar control is eating a healthy diet by:  Eating less sugar and carbohydrates  Increasing activity/exercise  Talking with your doctor about reaching your blood sugar goals High  blood sugars (greater than 180 mg/dL) can raise your risk of infections and slow your recovery, so you will need to focus on controlling your diabetes during the weeks before surgery. Make sure that the doctor who takes care of your diabetes knows about your planned surgery including the date and location.  How do I manage my blood sugar before surgery? Check your blood sugar at least 4 times a day, starting 2 days before surgery, to make sure that the level is not too high or low. Check your blood sugar the morning of your surgery when you wake up and every 2 hours until you get to the Short Stay unit. If your blood sugar is less than 70 mg/dL, you will need to treat for low blood sugar: Do not take insulin. Treat a low blood sugar (less than 70 mg/dL) with  cup of clear juice (cranberry or apple), 4 glucose tablets, OR glucose gel. Recheck blood sugar in 15 minutes after treatment (to make sure it is greater than 70 mg/dL). If your blood sugar is not greater than 70 mg/dL on recheck, call [phone] for further instructions. Report your blood sugar to the short stay nurse when you get to Short Stay.  If you are admitted to the hospital after surgery: Your blood sugar will be checked by the staff and you will probably be given insulin after surgery (instead of oral diabetes medicines) to make sure you have good blood sugar levels. The goal for blood sugar control after surgery is 80-180 mg/dL.   WHAT DO I DO ABOUT MY DIABETES MEDICATION?  THE DAY BEFORE SURGERY, Take glimepiride(amaryl) and januvia in the  morning as usual. Take ONLY half of the dose of tresiba insulin at night time    THE MORNING OF SURGERY,DO NOT TAKE ANY ORAL DIABETIC MEDICATIONS DAY OF YOUR SURGERY   DO NOT TAKE THE FOLLOWING 7 DAYS PRIOR TO SURGERY: Ozempic, Wegovy, Rybelsus (Semaglutide), Byetta (exenatide), Bydureon (exenatide ER), Victoza, Saxenda (liraglutide), or Trulicity (dulaglutide) Mounjaro (Tirzepatide) Adlyxin (Lixisenatide), Polyethylene Glycol Loxenatide.                              You may not have any metal on your body including hair pins, jewelry, and body piercing             Do not wear make-up, lotions, powders, perfumes/cologne, or deodorant  Do not wear nail polish including gel and S&S, artificial/acrylic nails, or any other type of covering on natural nails including finger and toenails. If you have artificial nails, gel coating, etc. that needs to be removed by a nail salon please have this removed prior to surgery or surgery may need to be canceled/ delayed if the surgeon/ anesthesia feels like they are unable to be safely monitored.   Do not shave  48 hours prior to surgery.   Do not bring valuables to the hospital. [place].   Contacts, glasses, or bridgework may not be worn into surgery.   Bring small overnight bag day of surgery.   DO NOT Force. PHARMACY WILL DISPENSE MEDICATIONS LISTED ON YOUR MEDICATION LIST TO YOU DURING YOUR ADMISSION [place]!    Patients discharged on the day of surgery will not be allowed to drive home.  Someone NEEDS to stay with you  for the first 24 hours after anesthesia.   Special Instructions: Bring a copy of your healthcare power of attorney and living will documents         the day of surgery if you haven't scanned them before.              Please read over the following fact sheets you were given: IF YOU HAVE QUESTIONS ABOUT YOUR PRE-OP INSTRUCTIONS PLEASE CALL  [phone]    [facility] - Preparing for Surgery Before surgery, you can play an important role.  Because skin is not sterile, your skin needs to be as free of germs as possible.  You can reduce the number of germs on your skin by washing with CHG (chlorahexidine gluconate) soap before surgery.  CHG is an antiseptic cleaner which kills germs and bonds with the skin to continue killing germs even after washing. Please DO NOT use if you have an allergy to CHG or antibacterial soaps.  If your skin becomes reddened/irritated stop using the CHG and inform your nurse when you arrive at Short Stay. Do not shave (including legs and underarms) for at least 48 hours prior to the first CHG shower.  You may shave your face/neck. Please follow these instructions carefully:  1.  Shower with CHG Soap the night before surgery and the  morning of Surgery.  2.  If you choose to wash your hair, wash your hair first as usual with your  normal  shampoo.  3.  After you shampoo, rinse your hair and body thoroughly to remove the  shampoo.                           4.  Use CHG as you would any other liquid soap.  You can apply chg directly  to the skin and wash                       Gently with a scrungie or clean washcloth.  5.  Apply the CHG Soap to your body ONLY FROM THE NECK DOWN.   Do not use on face/ open                           Wound or open sores. Avoid contact with eyes, ears mouth and genitals (private parts).                       Wash face,  Genitals (private parts) with your normal soap.             6.  Wash thoroughly, paying special attention to the area where your surgery  will be performed.  7.  Thoroughly rinse your body with warm water from the neck down.  8.  DO NOT shower/wash with your normal soap after using and rinsing off  the CHG Soap.                9.  Pat yourself dry with a clean towel.            10.  Wear clean pajamas.            11.  Place clean sheets on your bed the night of your  first shower and do not  sleep with pets. Day of Surgery : Do not apply any lotions/deodorants the morning of surgery.  Please wear clean clothes to the hospital/surgery  center.  FAILURE TO FOLLOW THESE INSTRUCTIONS MAY RESULT IN THE CANCELLATION OF YOUR SURGERY PATIENT SIGNATURE_________________________________  NURSE SIGNATURE__________________________________  ________________________________________________________________________

## 2022-06-20 ENCOUNTER — Other Ambulatory Visit: Payer: Self-pay

## 2022-06-20 ENCOUNTER — Encounter (HOSPITAL_COMMUNITY): Payer: Self-pay

## 2022-06-20 ENCOUNTER — Encounter (HOSPITAL_COMMUNITY)
Admission: RE | Admit: 2022-06-20 | Discharge: 2022-06-20 | Disposition: A | Discharge status: Home / Self Care | Payer: 59 | Source: Ambulatory Visit | Attending: Psychiatry | Admitting: Psychiatry

## 2022-06-20 VITALS — BP 125/69 | HR 82 | Temp 99.3°F | Ht 62.0 in | Wt 216.0 lb

## 2022-06-20 DIAGNOSIS — E119 Type 2 diabetes mellitus without complications: Secondary | ICD-10-CM | POA: Insufficient documentation

## 2022-06-20 DIAGNOSIS — I10 Essential (primary) hypertension: Secondary | ICD-10-CM | POA: Insufficient documentation

## 2022-06-20 DIAGNOSIS — R9431 Abnormal electrocardiogram [ECG] [EKG]: Secondary | ICD-10-CM | POA: Insufficient documentation

## 2022-06-20 DIAGNOSIS — Z01818 Encounter for other preprocedural examination: Secondary | ICD-10-CM | POA: Diagnosis present

## 2022-06-20 DIAGNOSIS — C541 Malignant neoplasm of endometrium: Secondary | ICD-10-CM | POA: Diagnosis not present

## 2022-06-20 HISTORY — DX: Malignant (primary) neoplasm, unspecified: C80.1

## 2022-06-20 HISTORY — DX: Anemia, unspecified: D64.9

## 2022-06-20 LAB — CBC
HCT: 38.9 % (ref 36.0–46.0)
Hemoglobin: 12.7 g/dL (ref 12.0–15.0)
MCH: 29 pg (ref 26.0–34.0)
MCHC: 32.6 g/dL (ref 30.0–36.0)
MCV: 88.8 fL (ref 80.0–100.0)
Platelets: 407 10*3/uL — ABNORMAL HIGH (ref 150–400)
RBC: 4.38 MIL/uL (ref 3.87–5.11)
RDW: 13.4 % (ref 11.5–15.5)
WBC: 7.4 10*3/uL (ref 4.0–10.5)
nRBC: 0 % (ref 0.0–0.2)

## 2022-06-20 LAB — BASIC METABOLIC PANEL
Anion gap: 7 (ref 5–15)
BUN: 30 mg/dL — ABNORMAL HIGH (ref 6–20)
CO2: 22 mmol/L (ref 22–32)
Calcium: 11.6 mg/dL — ABNORMAL HIGH (ref 8.9–10.3)
Chloride: 109 mmol/L (ref 98–111)
Creatinine, Ser: 1.02 mg/dL — ABNORMAL HIGH (ref 0.44–1.00)
GFR, Estimated: >60 mL/min (ref >60)
Glucose, Bld: 204 mg/dL — ABNORMAL HIGH (ref 70–99)
Potassium: 4.4 mmol/L (ref 3.5–5.1)
Sodium: 138 mmol/L (ref 135–145)

## 2022-06-20 LAB — GLUCOSE, CAPILLARY: Glucose-Capillary: 219 mg/dL — ABNORMAL HIGH (ref 70–99)

## 2022-06-20 NOTE — Progress Notes (Addendum)
For Short Stay: [place] appointment date:  Bowel Prep reminder: N/A   For Anesthesia: PCP - Dr. Rachell Cipro Cardiologist - Dr. Joya Gaskins. LOV: 2018.  Chest x-ray -  EKG -  Stress Test -  ECHO - 03/20/17 Cardiac Cath -  Pacemaker/ICD device last checked: Pacemaker orders received: Device Rep notified:  Spinal Cord Stimulator:  Sleep Study -  CPAP -   Fasting Blood Sugar - 200's Checks Blood Sugar : Freestyle Date and result of last Hgb A1c-11  Last dose of GLP1 agonist-  GLP1 instructions:   Last dose of SGLT-2 inhibitors- N/A SGLT-2 instructions:   Blood Thinner Instructions: Aspirin Instructions: No instructions for aspirin 325 mg. RN instruct the pt. To ask MD. About it. Last Dose:  Activity level: Can go up a flight of stairs and activities of daily living without stopping and without chest pain and/or shortness of breath   Able to exercise without chest pain and/or shortness of breath  Anesthesia review: Hx: HTN,DIA,Stroke,Seizure(only once: 2019)  Patient denies shortness of breath, fever, cough and chest pain at PAT appointment   Patient verbalized understanding of instructions that were given to them at the PAT appointment. Patient was also instructed that they will need to review over the PAT instructions again at home before surgery.

## 2022-06-21 ENCOUNTER — Telehealth: Payer: Self-pay | Admitting: *Deleted

## 2022-06-21 LAB — HEMOGLOBIN A1C
Hgb A1c MFr Bld: 9.5 % — ABNORMAL HIGH (ref 4.8–5.6)
Mean Plasma Glucose: 226 mg/dL

## 2022-06-21 NOTE — Progress Notes (Signed)
Lab. Results: A1C: 9.5

## 2022-06-21 NOTE — Telephone Encounter (Signed)
Patient to pick up VOYA forms

## 2022-06-24 ENCOUNTER — Telehealth: Payer: Self-pay

## 2022-06-24 NOTE — Telephone Encounter (Signed)
Telephone call to check on pre-operative status.  Patient compliant with pre-operative instructions.  Reinforced nothing to eat after midnight. Clear liquids until 0700. Patient to arrive at 0745.  No questions or concerns voiced.  Instructed to call for any needs.  ?

## 2022-06-25 ENCOUNTER — Encounter (HOSPITAL_COMMUNITY): Payer: Self-pay | Admitting: Psychiatry

## 2022-06-25 ENCOUNTER — Ambulatory Visit (HOSPITAL_BASED_OUTPATIENT_CLINIC_OR_DEPARTMENT_OTHER): Payer: 59 | Admitting: Certified Registered"

## 2022-06-25 ENCOUNTER — Other Ambulatory Visit: Payer: Self-pay

## 2022-06-25 ENCOUNTER — Encounter (HOSPITAL_COMMUNITY)
Admission: RE | Disposition: A | Discharge status: Home / Self Care | Payer: Self-pay | Source: Home / Self Care | Attending: Psychiatry

## 2022-06-25 ENCOUNTER — Ambulatory Visit (HOSPITAL_COMMUNITY)
Admission: RE | Admit: 2022-06-25 | Discharge: 2022-06-25 | Disposition: A | Discharge status: Home / Self Care | Payer: 59 | Attending: Psychiatry | Admitting: Psychiatry

## 2022-06-25 ENCOUNTER — Ambulatory Visit (HOSPITAL_COMMUNITY): Payer: 59 | Admitting: Physician Assistant

## 2022-06-25 DIAGNOSIS — I110 Hypertensive heart disease with heart failure: Secondary | ICD-10-CM | POA: Insufficient documentation

## 2022-06-25 DIAGNOSIS — I10 Essential (primary) hypertension: Secondary | ICD-10-CM

## 2022-06-25 DIAGNOSIS — Z7982 Long term (current) use of aspirin: Secondary | ICD-10-CM | POA: Insufficient documentation

## 2022-06-25 DIAGNOSIS — Z794 Long term (current) use of insulin: Secondary | ICD-10-CM | POA: Insufficient documentation

## 2022-06-25 DIAGNOSIS — Z79818 Long term (current) use of other agents affecting estrogen receptors and estrogen levels: Secondary | ICD-10-CM | POA: Diagnosis not present

## 2022-06-25 DIAGNOSIS — E1165 Type 2 diabetes mellitus with hyperglycemia: Secondary | ICD-10-CM | POA: Insufficient documentation

## 2022-06-25 DIAGNOSIS — E119 Type 2 diabetes mellitus without complications: Secondary | ICD-10-CM | POA: Diagnosis not present

## 2022-06-25 DIAGNOSIS — C541 Malignant neoplasm of endometrium: Secondary | ICD-10-CM

## 2022-06-25 DIAGNOSIS — D259 Leiomyoma of uterus, unspecified: Secondary | ICD-10-CM | POA: Diagnosis not present

## 2022-06-25 DIAGNOSIS — Z30430 Encounter for insertion of intrauterine contraceptive device: Secondary | ICD-10-CM | POA: Diagnosis not present

## 2022-06-25 DIAGNOSIS — G40909 Epilepsy, unspecified, not intractable, without status epilepticus: Secondary | ICD-10-CM | POA: Insufficient documentation

## 2022-06-25 DIAGNOSIS — Z8673 Personal history of transient ischemic attack (TIA), and cerebral infarction without residual deficits: Secondary | ICD-10-CM | POA: Diagnosis not present

## 2022-06-25 DIAGNOSIS — Z6841 Body Mass Index (BMI) 40.0 and over, adult: Secondary | ICD-10-CM | POA: Insufficient documentation

## 2022-06-25 DIAGNOSIS — Z7984 Long term (current) use of oral hypoglycemic drugs: Secondary | ICD-10-CM | POA: Diagnosis not present

## 2022-06-25 HISTORY — PX: INTRAUTERINE DEVICE (IUD) INSERTION: SHX5877

## 2022-06-25 HISTORY — PX: DILATION AND CURETTAGE OF UTERUS: SHX78

## 2022-06-25 LAB — GLUCOSE, CAPILLARY
Glucose-Capillary: 107 mg/dL — ABNORMAL HIGH (ref 70–99)
Glucose-Capillary: 98 mg/dL (ref 70–99)

## 2022-06-25 SURGERY — DILATION AND CURETTAGE
Anesthesia: General

## 2022-06-25 MED ORDER — FENTANYL CITRATE (PF) 100 MCG/2ML IJ SOLN
INTRAMUSCULAR | Status: AC
  Filled 2022-06-25: qty 2

## 2022-06-25 MED ORDER — OXYCODONE HCL 5 MG/5ML PO SOLN: 5.0000 mg | Freq: Once | ORAL | Status: DC | PRN

## 2022-06-25 MED ORDER — CHLORHEXIDINE GLUCONATE 0.12 % MT SOLN
15.0000 mL | Freq: Once | OROMUCOSAL | Status: AC
  Administered 2022-06-25: 15 mL via OROMUCOSAL

## 2022-06-25 MED ORDER — ACETAMINOPHEN 10 MG/ML IV SOLN
INTRAVENOUS | Status: AC
  Filled 2022-06-25: qty 100

## 2022-06-25 MED ORDER — ORAL CARE MOUTH RINSE: 15.0000 mL | Freq: Once | OROMUCOSAL | Status: AC

## 2022-06-25 MED ORDER — PROPOFOL 10 MG/ML IV BOLUS
INTRAVENOUS | Status: AC
  Filled 2022-06-25: qty 20

## 2022-06-25 MED ORDER — LACTATED RINGERS IV SOLN: INTRAVENOUS | Status: DC

## 2022-06-25 MED ORDER — ONDANSETRON HCL 4 MG/2ML IJ SOLN
INTRAMUSCULAR | Status: DC | PRN
  Administered 2022-06-25: 4 mg via INTRAVENOUS

## 2022-06-25 MED ORDER — KETOROLAC TROMETHAMINE 30 MG/ML IJ SOLN: 30.0000 mg | Freq: Once | INTRAMUSCULAR | Status: DC | PRN

## 2022-06-25 MED ORDER — MIDAZOLAM HCL 2 MG/2ML IJ SOLN
INTRAMUSCULAR | Status: AC
  Filled 2022-06-25: qty 2

## 2022-06-25 MED ORDER — LEVONORGESTREL 20 MCG/DAY IU IUD
1.0000 | INTRAUTERINE_SYSTEM | INTRAUTERINE | Status: DC
  Filled 2022-06-25: qty 1

## 2022-06-25 MED ORDER — ACETAMINOPHEN 500 MG PO TABS
1000.0000 mg | ORAL_TABLET | ORAL | Status: AC
  Administered 2022-06-25: 1000 mg via ORAL
  Filled 2022-06-25: qty 2

## 2022-06-25 MED ORDER — MIDAZOLAM HCL 5 MG/5ML IJ SOLN
INTRAMUSCULAR | Status: DC | PRN
  Administered 2022-06-25: 2 mg via INTRAVENOUS

## 2022-06-25 MED ORDER — ONDANSETRON HCL 4 MG/2ML IJ SOLN: 4.0000 mg | Freq: Once | INTRAMUSCULAR | Status: DC | PRN

## 2022-06-25 MED ORDER — KETOROLAC TROMETHAMINE 30 MG/ML IJ SOLN
INTRAMUSCULAR | Status: AC
  Filled 2022-06-25: qty 1

## 2022-06-25 MED ORDER — KETOROLAC TROMETHAMINE 30 MG/ML IJ SOLN
INTRAMUSCULAR | Status: DC | PRN
  Administered 2022-06-25: 30 mg via INTRAVENOUS

## 2022-06-25 MED ORDER — DEXAMETHASONE SODIUM PHOSPHATE 4 MG/ML IJ SOLN: 4.0000 mg | INTRAMUSCULAR | Status: DC

## 2022-06-25 MED ORDER — LIDOCAINE HCL (PF) 1 % IJ SOLN
INTRAMUSCULAR | Status: AC
  Filled 2022-06-25: qty 30

## 2022-06-25 MED ORDER — DEXAMETHASONE SODIUM PHOSPHATE 10 MG/ML IJ SOLN
INTRAMUSCULAR | Status: AC
  Filled 2022-06-25: qty 1

## 2022-06-25 MED ORDER — DEXAMETHASONE SODIUM PHOSPHATE 10 MG/ML IJ SOLN
INTRAMUSCULAR | Status: DC | PRN
  Administered 2022-06-25: 4 mg via INTRAVENOUS

## 2022-06-25 MED ORDER — SODIUM CHLORIDE 0.9 % IR SOLN
Status: DC | PRN
  Administered 2022-06-25: 1000 mL

## 2022-06-25 MED ORDER — LIDOCAINE 2% (20 MG/ML) 5 ML SYRINGE
INTRAMUSCULAR | Status: DC | PRN
  Administered 2022-06-25: 60 mg via INTRAVENOUS

## 2022-06-25 MED ORDER — PROPOFOL 10 MG/ML IV BOLUS
INTRAVENOUS | Status: DC | PRN
  Administered 2022-06-25: 200 mg via INTRAVENOUS

## 2022-06-25 MED ORDER — ONDANSETRON HCL 4 MG/2ML IJ SOLN
INTRAMUSCULAR | Status: AC
  Filled 2022-06-25: qty 2

## 2022-06-25 MED ORDER — FENTANYL CITRATE PF 50 MCG/ML IJ SOSY: 25.0000 ug | PREFILLED_SYRINGE | INTRAMUSCULAR | Status: DC | PRN

## 2022-06-25 MED ORDER — OXYCODONE HCL 5 MG PO TABS: 5.0000 mg | ORAL_TABLET | Freq: Once | ORAL | Status: DC | PRN

## 2022-06-25 MED ORDER — FENTANYL CITRATE (PF) 250 MCG/5ML IJ SOLN
INTRAMUSCULAR | Status: DC | PRN
  Administered 2022-06-25 (×2): 50 ug via INTRAVENOUS

## 2022-06-25 SURGICAL SUPPLY — 22 items
BAG COUNTER SPONGE SURGICOUNT (BAG) ×1 IMPLANT
BAG SPNG CNTER NS LX DISP (BAG) ×1
DEVICE MYOSURE LITE (MISCELLANEOUS) IMPLANT
DEVICE MYOSURE REACH (MISCELLANEOUS) IMPLANT
DILATOR CANAL MILEX (MISCELLANEOUS) IMPLANT
GAUZE 4X4 16PLY ~~LOC~~+RFID DBL (SPONGE) IMPLANT
GLOVE BIO SURGEON STRL SZ 6 (GLOVE) ×1 IMPLANT
GLOVE BIO SURGEON STRL SZ 6.5 (GLOVE) IMPLANT
GOWN STRL REUS W/ TWL LRG LVL3 (GOWN DISPOSABLE) ×1 IMPLANT
GOWN STRL REUS W/TWL LRG LVL3 (GOWN DISPOSABLE) ×1
IV NS IRRIG 3000ML ARTHROMATIC (IV SOLUTION) ×1 IMPLANT
KIT PROCEDURE FLUENT (KITS) IMPLANT
LOOP CUTTING BIPOLAR 21FR (ELECTRODE) IMPLANT
MYOSURE XL FIBROID (MISCELLANEOUS)
PACK VAGINAL WOMENS (CUSTOM PROCEDURE TRAY) ×1 IMPLANT
PAD OB MATERNITY 4.3X12.25 (PERSONAL CARE ITEMS) IMPLANT
SEAL ROD LENS SCOPE MYOSURE (ABLATOR) IMPLANT
SYS MIRENA INTRAUTERINE (MISCELLANEOUS) ×1
SYSTEM MIRENA INTRAUTERINE (MISCELLANEOUS) IMPLANT
SYSTEM TISS REMOVAL MYOSURE XL (MISCELLANEOUS) IMPLANT
TOWEL OR 17X26 10 PK STRL BLUE (TOWEL DISPOSABLE) ×1 IMPLANT
WATER STERILE IRR 500ML POUR (IV SOLUTION) ×1 IMPLANT

## 2022-06-25 NOTE — Anesthesia Preprocedure Evaluation (Addendum)
Anesthesia Evaluation  Patient identified by MRN, date of birth, ID band Patient awake    Reviewed: Allergy & Precautions, H&P , NPO status , Patient's Chart, lab work & pertinent test results  Airway Mallampati: II  TM Distance: >3 FB Neck ROM: Full    Dental no notable dental hx.    Pulmonary neg pulmonary ROS   Pulmonary exam normal breath sounds clear to auscultation       Cardiovascular hypertension, Pt. on medications Normal cardiovascular exam Rhythm:Regular Rate:Normal     Neuro/Psych Seizures -, Well Controlled,  TIACVA  negative psych ROS   GI/Hepatic negative GI ROS, Neg liver ROS,,,  Endo/Other  diabetes, Insulin Dependent  Morbid obesity  Renal/GU negative Renal ROS  negative genitourinary   Musculoskeletal negative musculoskeletal ROS (+)    Abdominal   Peds negative pediatric ROS (+)  Hematology negative hematology ROS (+)   Anesthesia Other Findings   Reproductive/Obstetrics negative OB ROS                             Anesthesia Physical Anesthesia Plan  ASA: 3  Anesthesia Plan: General   Post-op Pain Management: Minimal or no pain anticipated   Induction: Intravenous  PONV Risk Score and Plan: 3 and Ondansetron, Dexamethasone, Midazolam and Treatment may vary due to age or medical condition  Airway Management Planned: LMA  Additional Equipment:   Intra-op Plan:   Post-operative Plan: Extubation in OR  Informed Consent: I have reviewed the patients History and Physical, chart, labs and discussed the procedure including the risks, benefits and alternatives for the proposed anesthesia with the patient or authorized representative who has indicated his/her understanding and acceptance.     Dental advisory given  Plan Discussed with: CRNA and Surgeon  Anesthesia Plan Comments:        Anesthesia Quick Evaluation

## 2022-06-25 NOTE — Discharge Instructions (Addendum)
06/25/2022  Medications - You can stop the megace. We can restart if the IUD is not enough. But okay to stop now that IUD is in place.  Activity: 1. Be up and out of the bed during the day.  Take a nap if needed.  You may walk up steps but be careful and use the hand rail.   2. Do not drive today. After that, you need to make sure your reaction time has returned and you can brake safely.  3. You can shower. No tub baths for 2 weeks.  4. No sexual activity and nothing in the vagina for 2 weeks.  5. You may experience vaginal spotting after surgery and may have irregular bleeding for the first few months with the IUD in place.  The spotting is normal but if you experience heavy bleeding, call our office.  6. Take Tylenol or ibuprofen first for pain. Do not take your aspirin and another medication like ibuprofen at the same time.  Monitor your Tylenol intake to a max of 4,000 mg.   Diet: 1. Low sodium Heart Healthy Diet is recommended.  2. It is safe to use a laxative, such as Miralax or Colace, if you have difficulty moving your bowels. You can take Sennakot at bedtime every evening to keep bowel movements regular and to prevent constipation.    Reasons to call the Doctor: Fever - Oral temperature greater than 100.4 degrees Fahrenheit Foul-smelling vaginal discharge Difficulty urinating Nausea and vomiting Increased pain at the site of the incision that is unrelieved with pain medicine. Difficulty breathing with or without chest pain New calf pain especially if only on one side Sudden, continuing increased vaginal bleeding with or without clots.   Contacts: For questions or concerns you should contact:  Dr. Ernestina Patches at [facility], NP at [phone]  After Hours: call [phone] and have the GYN Oncologist paged/contacted

## 2022-06-25 NOTE — Op Note (Signed)
GYNECOLOGIC ONCOLOGY OPERATIVE NOTE  Date of Service: 06/25/2022  Preoperative Diagnosis: FIGO grade 1 endometrioid endometrial cancer, poorly controlled diabetes  Postoperative Diagnosis: Same  Procedures: Dilation and curettage, Mirena intrauterine device insertion, vaginal biopsy  Surgeon: Bernadene Bell, MD  Assistants: None  Anesthesia: LMA  Estimated Blood Loss: 74m  Fluids: 700 ml, crystalloid  Urine Output: not assessed  Findings: Bimanual exam with cervix pulled cephalad. Enlarged uterus to around the level of the umbilicus. Uterine cavity sounds to approximately 15cm. Gritty texture of the endometrium in all quadrants at conclusion of the case. Hyperglandular appearance, rough texture of vaginal mucosa at introitus, biopsy obtained.  Specimens:  ID Type Source Tests Collected by Time Destination  1 : endometrial currettings GYN Endometrium Curettage SURGICAL PATHOLOGY NBernadene Bell MD 06/25/2022 [phone]  2 : posterior vaginal biopsy Tissue PATH Soft tissue SURGICAL PATHOLOGY NBernadene Bell MD [id number]    Complications:  None  Indications for Procedure: Kelly DELANGEis a 52y.o. woman with a new diagnosis of endometrial cancer and poorly controlled diabetes.  Prior to the procedure, all risks, benefits, and alternatives were discussed and informed surgical consent was signed.  Procedure: Patient was taken to the operating room where general anesthesia with LMA was achieved.  She was positioned in dorsal lithotomy and prepped and draped in sterile fashion.  The above findings were noted.  A speculum was inserted into the vagina. A tenaculum was placed on the posterior lip of the cervix. The cervix was serially dilated with pratt dilators. A sound was introduced to the uterine cavity, measuring approximately 15cm. A curette was insert to the fundus and a curettage was performed in standard fashion until a gritty texture was obtained in all quadrants. The  mirena IUD was then inserted to the uterine fundus. The strings were trimmed at approximately 3cm. The tenaculum was removed with hemostasis noted. The speculum was removed from the vagina. A portion of the posterior vaginal mucosa near the introitus was grasped with a forcep and a biopsy was obtained sharply with scissors. A figure of eight was then placed with 3-0 vicryl with hemostasis obtained. The vagina was irrigated.  Patient tolerated the procedure well. Sponge, lap, and instrument counts were correct.  No perioperative antibiotics were indicated for this procedure.  Patient was extubated and taken to the PACU in stable condition.  MBernadene Bell MD Gynecologic Oncology

## 2022-06-25 NOTE — Anesthesia Procedure Notes (Signed)
Procedure Name: LMA Insertion Date/Time: 06/25/2022 10:00 AM  Performed by: Cleda Daub, CRNAPre-anesthesia Checklist: Patient identified, Emergency Drugs available, Suction available and Patient being monitored Patient Re-evaluated:Patient Re-evaluated prior to induction Oxygen Delivery Method: Circle system utilized Preoxygenation: Pre-oxygenation with 100% oxygen Induction Type: IV induction LMA: LMA with gastric port inserted LMA Size: 4.0 Number of attempts: 1 Placement Confirmation: positive ETCO2 and breath sounds checked- equal and bilateral Tube secured with: Tape Dental Injury: Teeth and Oropharynx as per pre-operative assessment

## 2022-06-25 NOTE — Transfer of Care (Signed)
Immediate Anesthesia Transfer of Care Note  Patient: Kelly Davies  Procedure(s) Performed: DILATATION AND CURETTAGE, VAGINAL BIOPSY INTRAUTERINE DEVICE (IUD) INSERTION  Patient Location: PACU  Anesthesia Type:General  Level of Consciousness: awake, alert , oriented, and patient cooperative  Airway & Oxygen Therapy: Patient Spontanous Breathing and Patient connected to face mask oxygen  Post-op Assessment: Report given to RN and Post -op Vital signs reviewed and stable  Post vital signs: Reviewed and stable  Last Vitals:  Vitals Value Taken Time  BP 111/74 06/25/22 1037  Temp    Pulse 95 06/25/22 1039  Resp 16 06/25/22 1039  SpO2 96 % 06/25/22 1039  Vitals shown include unvalidated device data.  Last Pain:  Vitals:   06/25/22 0821  TempSrc:   PainSc: 0-No pain      Patients Stated Pain Goal: 4 ([id number])  Complications: No notable events documented.

## 2022-06-25 NOTE — Interval H&P Note (Signed)
History and Physical Interval Note:  06/25/2022 9:32 AM  Kelly Davies  has presented today for surgery, with the diagnosis of ENDOMETRIAL CANCER.  The various methods of treatment have been discussed with the patient and family. After consideration of risks, benefits and other options for treatment, the patient has consented to  Procedure(s): DILATATION AND CURETTAGE (N/A) INTRAUTERINE DEVICE (IUD) INSERTION (N/A) as a surgical intervention.  The patient's history has been reviewed, patient examined, no change in status, stable for surgery.  I have reviewed the patient's chart and labs.  Questions were answered to the patient's satisfaction.     Amparo Donalson

## 2022-06-26 ENCOUNTER — Telehealth: Payer: Self-pay

## 2022-06-26 ENCOUNTER — Encounter (HOSPITAL_COMMUNITY): Payer: Self-pay | Admitting: Psychiatry

## 2022-06-26 NOTE — Telephone Encounter (Signed)
LVM x2 for patient to call office regarding post op call from procedure on 3/12 by Dr. Ernestina Patches

## 2022-06-26 NOTE — Telephone Encounter (Signed)
Spoke with Ms. Sanford this morning. She states she is eating, drinking and urinating well. She has not had a BM yet but is passing gas. She is taking senokot as prescribed and encouraged her to drink plenty of water. She denies fever or chills. Incisions are dry and intact. She rates her pain 1/10. Her pain is controlled with Ibuprofen    Instructed to call office with any fever, chills, purulent drainage, uncontrolled pain or any other questions or concerns. Patient verbalizes understanding.   Pt aware of post op appointments as well as the office number [phone] and after hours number [phone] to call if she has any questions or concerns

## 2022-06-26 NOTE — Anesthesia Postprocedure Evaluation (Signed)
Anesthesia Post Note  Patient: Kelly Davies  Procedure(s) Performed: DILATATION AND CURETTAGE, VAGINAL BIOPSY INTRAUTERINE DEVICE (IUD) INSERTION     Patient location during evaluation: PACU Anesthesia Type: General Level of consciousness: awake and alert Pain management: pain level controlled Vital Signs Assessment: post-procedure vital signs reviewed and stable Respiratory status: spontaneous breathing, nonlabored ventilation, respiratory function stable and patient connected to nasal cannula oxygen Cardiovascular status: blood pressure returned to baseline and stable Postop Assessment: no apparent nausea or vomiting Anesthetic complications: no   No notable events documented.  Last Vitals:  Vitals:   06/25/22 1100 06/25/22 1113  BP: 115/74 128/80  Pulse: 89 86  Resp: 18 17  Temp:  36.7 C  SpO2: 100% 96%    Last Pain:  Vitals:   06/25/22 1037  TempSrc:   PainSc: 0-No pain                 [name] S

## 2022-07-07 NOTE — Progress Notes (Unsigned)
Gynecologic Oncology Return Clinic Visit  Date of Service: 07/08/2022 Referring Provider: Azucena Fallen, MD   Assessment & Plan: Kelly Davies is a 52 y.o. woman with FIGO grade 1 endometrial cancer as well as uterine fibroids and poorly controlled T2DM, history of stroke in 2018 who is 2 weeks s/p D&C, mirena IUD insertion, vaginal biopsy on 06/25/22  Postop: - Pt recovering well from surgery and healing appropriately postoperatively - Intraoperative findings and pathology results reviewed. - Ongoing postoperative expectations and precautions reviewed. Continue with no lifting >10lbs through 6 weeks postoperatively - Pt works ***. Okay to return to work at [employer] - Reviewed typical bleeding patterns with LNG IUD***.  Endometrial cancer: - ***mri, follow-up 3 mo for emb - stop megace  Poorly controlled diabetes: - A1c on 06/20/22 of 9.5 - Reviewed goal of A1c <8 prior to surgery - ***follow-up, glucose control   ***  RTC ***.  Bernadene Bell, MD Gynecologic Oncology   Medical Decision Making I personally spent  TOTAL *** minutes face-to-face and non-face-to-face in the care of this patient, which includes all pre, intra, and post visit time on the date of service.  *** minutes spent reviewing records prior to the visit *** Minutes in patient contact      *** minutes in other billable services *** minutes charting , conferring with consultants etc.   ----------------------- Reason for Visit: Postop***  Treatment History: Oncology History   No history exists.    Interval History: Pt reports that she is recovering well from surgery. She is using *** for pain. She is eating and drinking well. She is voiding without issue and having regular bowel movements.   ***  Past Medical/Surgical History: Past Medical History:  Diagnosis Date   Anemia    Cancer ([place])    Diabetes mellitus without complication ([place])    Hypercholesteremia    Hypertension    Partial seizure  ([place])    Stroke ([place])    TIA (transient ischemic attack)     Past Surgical History:  Procedure Laterality Date   COLONOSCOPY     DILATION AND CURETTAGE OF UTERUS N/A 06/25/2022   Procedure: DILATATION AND CURETTAGE, VAGINAL BIOPSY;  Surgeon: Bernadene Bell, MD;  Location: [facility] ORS;  Service: Gynecology;  Laterality: N/A;   INTRAUTERINE DEVICE (IUD) INSERTION N/A 06/25/2022   Procedure: INTRAUTERINE DEVICE (IUD) INSERTION;  Surgeon: Bernadene Bell, MD;  Location: [facility];  Service: Gynecology;  Laterality: N/A;   NO PAST SURGERIES     TEE WITHOUT CARDIOVERSION N/A 03/20/2017   Procedure: TRANSESOPHAGEAL ECHOCARDIOGRAM (TEE);  Surgeon: Adrian Prows, MD;  Location: [facility] ENDOSCOPY;  Service: Cardiovascular;  Laterality: N/A;    Family History  Problem Relation Age of Onset   Colon cancer Maternal Uncle        60s   Endometrial cancer Neg Hx    Breast cancer Neg Hx    Ovarian cancer Neg Hx     Social History   Socioeconomic History   Marital status: Single    Spouse name: Not on file   Number of children: Not on file   Years of education: Not on file   Highest education level: Not on file  Occupational History   Not on file  Tobacco Use   Smoking status: Never   Smokeless tobacco: Never  Vaping Use   Vaping Use: Never used  Substance and Sexual Activity   Alcohol use: No   Drug use: No   Sexual activity: Not Currently  Other Topics  Concern   Not on file  Social History Narrative   Not on file   Social Determinants of Health   Financial Resource Strain: Not on file  Food Insecurity: No Food Insecurity (06/13/2022)   Hunger Vital Sign    Worried About Running Out of Food in the Last Year: Never true    Ran Out of Food in the Last Year: Never true  Transportation Needs: No Transportation Needs (06/13/2022)   PRAPARE - Hydrologist (Medical): No    Lack of Transportation (Non-Medical): No  Physical Activity: Not on file  Stress: Not on file   Social Connections: Not on file    Current Medications:  Current Outpatient Medications:    aspirin 325 MG tablet, Take 1 tablet (325 mg total) by mouth daily., Disp: 30 tablet, Rfl: 0   atorvastatin (LIPITOR) 40 MG tablet, Take 1 tablet (40 mg total) by mouth daily at 6 PM. (Patient not taking: Reported on 06/18/2022), Disp: 30 tablet, Rfl: 0   cetirizine (ZYRTEC) 10 MG tablet, Take 10 mg by mouth daily., Disp: , Rfl:    Cholecalciferol (VITAMIN D-3) 125 MCG (5000 UT) TABS, Take 5,000 Units by mouth daily., Disp: , Rfl:    Continuous Blood Gluc Sensor (FREESTYLE LIBRE 14 DAY SENSOR) MISC, Use 1 sensor every 14 days, Disp: , Rfl:    folic acid (FOLVITE) 1 MG tablet, Take 1 mg by mouth daily., Disp: , Rfl:    glimepiride (AMARYL) 4 MG tablet, Take 4 mg by mouth daily with breakfast., Disp: , Rfl:    insulin degludec (TRESIBA FLEXTOUCH) 100 UNIT/ML FlexTouch Pen, Inject 20 Units into the skin at bedtime., Disp: , Rfl:    Iron-FA-B Cmp-C-Biot-Probiotic (FUSION PLUS PO), Take 1 capsule by mouth daily., Disp: , Rfl:    JANUVIA 100 MG tablet, Take 100 mg by mouth daily., Disp: , Rfl:    levETIRAcetam (KEPPRA) 500 MG tablet, Take 1 tablet (500 mg total) by mouth 2 (two) times daily. Must be seen for further refills. Call [phone] to make an appointment. (Patient taking differently: Take 500 mg by mouth daily.), Disp: 60 tablet, Rfl: 0   rosuvastatin (CRESTOR) 20 MG tablet, Take 20 mg by mouth daily., Disp: , Rfl:    valsartan-hydrochlorothiazide (DIOVAN-HCT) 80-12.5 MG tablet, Take 1 tablet by mouth daily., Disp: , Rfl:   Review of Symptoms: Complete 10-system review is negative except as above in Interval History.  Physical Exam: LMP 12/27/2021  General: ***Alert, oriented, no acute distress. HEENT: ***Normocephalic, atraumatic. Neck symmetric without masses. Sclera anicteric.  Chest: ***Normal work of breathing. ***Clear to auscultation bilaterally.   Cardiovascular: ***Regular rate and  rhythm, no murmurs. Abdomen: ***Soft, nontender.  Normoactive bowel sounds.  No masses or hepatosplenomegaly appreciated.  ***Well-healing incision***s. Extremities: ***Grossly normal range of motion.  Warm, well perfused.  No edema bilaterally. Skin: ***No rashes or lesions noted. GU: Normal appearing external genitalia without erythema, excoriation, or lesions.  Speculum exam reveals small blood in vaginal vault. Strings visualized from cervical os.  Exam chaperoned by Thelma Martinique, [place]   Laboratory & Radiologic Studies: ***

## 2022-07-08 ENCOUNTER — Inpatient Hospital Stay (HOSPITAL_BASED_OUTPATIENT_CLINIC_OR_DEPARTMENT_OTHER): Payer: 59 | Admitting: Psychiatry

## 2022-07-08 ENCOUNTER — Other Ambulatory Visit: Payer: Self-pay

## 2022-07-08 VITALS — BP 137/72 | HR 90 | Temp 98.8°F | Resp 18 | Ht 62.0 in | Wt 218.2 lb

## 2022-07-08 DIAGNOSIS — E119 Type 2 diabetes mellitus without complications: Secondary | ICD-10-CM

## 2022-07-08 DIAGNOSIS — C541 Malignant neoplasm of endometrium: Secondary | ICD-10-CM

## 2022-07-08 NOTE — Patient Instructions (Signed)
It was a pleasure to see you in clinic today. - If bleeding that soaks through more than 2 pads an hour, let us know. Or if you are concerned that your IUD may have fallen out. - Return visit planned for 3 months.   Thank you very much for allowing me to provide care for you today.  I appreciate your confidence in choosing our Gynecologic Oncology team at [facility].  If you have any questions about your visit today please call our office or send [place] a MyChart message and we will get back to you as soon as possible.

## 2022-07-10 ENCOUNTER — Encounter: Payer: Self-pay | Admitting: Psychiatry

## 2022-07-15 ENCOUNTER — Telehealth: Payer: Self-pay

## 2022-07-15 NOTE — Telephone Encounter (Signed)
Pt is aware of message from Dr. Ernestina Patches. She will call if she needs a refill on the Megace.

## 2022-07-15 NOTE — Telephone Encounter (Signed)
Pt called stating she was having pain/cramping last night and this morning (about 16min to an hour ago) her IUD came out. She saw it in the toilet and flushed it before she realized what it was. She is not having any cramping now but has been having bleeding ever since it was placed.  Dr. Ernestina Patches notified.

## 2022-08-05 ENCOUNTER — Telehealth: Payer: Self-pay | Admitting: *Deleted

## 2022-08-05 ENCOUNTER — Encounter: Payer: Self-pay | Admitting: Psychiatry

## 2022-08-05 NOTE — Telephone Encounter (Signed)
Spoke with the patient and moved appt from 6/17 to 4/29 per Dr Alvester Morin

## 2022-08-06 ENCOUNTER — Telehealth: Payer: Self-pay

## 2022-08-06 NOTE — Telephone Encounter (Signed)
See message below from Warner Mccreedy NP  Pt is aware of surgery date on 5/21. Follow up with Alvester Morin on 5/13 @ 2:45pm and pre-op with Warner Mccreedy NP at 3:00pm

## 2022-08-06 NOTE — Telephone Encounter (Signed)
-----   Message from Doylene Bode, NP sent at 08/06/2022  1:35 PM EDT ----- Please let the patient know we have her surgery tentatively scheduled for May 21. We will need to see her in the office for preop visit with Dr. Dorris Carnes and myself. Date can always change if needed. Just wanted a spot held for her.

## 2022-08-07 ENCOUNTER — Telehealth: Payer: Self-pay | Admitting: *Deleted

## 2022-08-07 NOTE — Telephone Encounter (Signed)
Per Dr Alvester Morin fax records and surgical optimization forms to the patient PCP and hematology office.

## 2022-08-12 ENCOUNTER — Ambulatory Visit: Payer: 59 | Admitting: Psychiatry

## 2022-08-14 ENCOUNTER — Encounter: Payer: Self-pay | Admitting: Psychiatry

## 2022-08-14 NOTE — Telephone Encounter (Signed)
Spoke with the patient's PCP office, they are working on the clearance form   Spoke with the patient's hematology office and refax notes and form

## 2022-08-15 ENCOUNTER — Encounter: Payer: Self-pay | Admitting: Gynecologic Oncology

## 2022-08-16 ENCOUNTER — Other Ambulatory Visit: Payer: Self-pay | Admitting: Gynecologic Oncology

## 2022-08-16 DIAGNOSIS — C541 Malignant neoplasm of endometrium: Secondary | ICD-10-CM

## 2022-08-16 MED ORDER — MEGESTROL ACETATE 40 MG PO TABS: 80.0000 mg | ORAL_TABLET | Freq: Two times a day (BID) | ORAL | 0 refills | Status: DC

## 2022-08-21 NOTE — Patient Instructions (Signed)
DUE TO COVID-19 ONLY TWO VISITORS  (aged 52 and older)  ARE ALLOWED TO COME WITH YOU AND STAY IN THE WAITING ROOM ONLY DURING PRE OP AND PROCEDURE.   **NO VISITORS ARE ALLOWED IN THE SHORT STAY AREA OR RECOVERY ROOM!!**  IF YOU WILL BE ADMITTED INTO THE HOSPITAL YOU ARE ALLOWED ONLY FOUR SUPPORT PEOPLE DURING VISITATION HOURS ONLY (7 AM -8PM)   The support person(s) must pass our screening, gel in and out, and wear a mask at all times, including in the patient's room. Patients must also wear a mask when staff or their support person are in the room. Visitors GUEST BADGE MUST BE WORN VISIBLY  One adult visitor may remain with you overnight and MUST be in the room by 8 P.M.     Your procedure is scheduled on: 09/03/22   Report to [facility] Main Entrance    Report to admitting at : 7:45 AM   Call this number if you have problems the morning of surgery [phone]   Eat a light diet the day before surgery.  Examples including soups, broths, toast, yogurt, mashed potatoes.  Things to avoid include carbonated beverages (fizzy beverages), raw fruits and raw vegetables, or beans.   If your bowels are filled with gas, your surgeon will have difficulty visualizing your pelvic organs which increases your surgical risks.  Do not eat food :After Midnight.   After Midnight you may have the following liquids until : 7:00 AM DAY OF SURGERY  Water Black Coffee (sugar ok, NO MILK/CREAM OR CREAMERS)  Tea (sugar ok, NO MILK/CREAM OR CREAMERS) regular and decaf                             Plain Jell-O (NO RED)                                           Fruit ices (not with fruit pulp, NO RED)                                     Popsicles (NO RED)                                                                  Juice: apple, WHITE grape, WHITE cranberry Sports drinks like Gatorade (NO RED)              Oral Hygiene is also important to reduce your risk of infection.                                     Remember - BRUSH YOUR TEETH THE MORNING OF SURGERY WITH YOUR REGULAR TOOTHPASTE  DENTURES WILL BE REMOVED PRIOR TO SURGERY PLEASE DO NOT APPLY "Poly grip" OR ADHESIVES!!!   Do NOT smoke after Midnight   Take these medicines the morning of surgery with A SIP OF WATER: levetiracetam(kepra),cetirizine.  How to Manage Your Diabetes Before and After Surgery  Why  is it important to control my blood sugar before and after surgery? Improving blood sugar levels before and after surgery helps healing and can limit problems. A way of improving blood sugar control is eating a healthy diet by:  Eating less sugar and carbohydrates  Increasing activity/exercise  Talking with your doctor about reaching your blood sugar goals High blood sugars (greater than 180 mg/dL) can raise your risk of infections and slow your recovery, so you will need to focus on controlling your diabetes during the weeks before surgery. Make sure that the doctor who takes care of your diabetes knows about your planned surgery including the date and location.  How do I manage my blood sugar before surgery? Check your blood sugar at least 4 times a day, starting 2 days before surgery, to make sure that the level is not too high or low. Check your blood sugar the morning of your surgery when you wake up and every 2 hours until you get to the Short Stay unit. If your blood sugar is less than 70 mg/dL, you will need to treat for low blood sugar: Do not take insulin. Treat a low blood sugar (less than 70 mg/dL) with  cup of clear juice (cranberry or apple), 4 glucose tablets, OR glucose gel. Recheck blood sugar in 15 minutes after treatment (to make sure it is greater than 70 mg/dL). If your blood sugar is not greater than 70 mg/dL on recheck, call [phone] for further instructions. Report your blood sugar to the short stay nurse when you get to Short Stay.  If you are admitted to the hospital after surgery: Your blood  sugar will be checked by the staff and you will probably be given insulin after surgery (instead of oral diabetes medicines) to make sure you have good blood sugar levels. The goal for blood sugar control after surgery is 80-180 mg/dL.   WHAT DO I DO ABOUT MY DIABETES MEDICATION?  Do not take oral diabetes medicines (pills) the morning of surgery.  THE NIGHT BEFORE SURGERY, DO NOT take tresiba insulin.     THE MORNING OF SURGERY,DO NOT TAKE ANY ORAL DIABETIC MEDICATIONS DAY OF YOUR SURGERY  DO NOT TAKE THE FOLLOWING 7 DAYS PRIOR TO SURGERY: Ozempic, Wegovy, Rybelsus (Semaglutide), Byetta (exenatide), Bydureon (exenatide ER), Victoza, Saxenda (liraglutide), or Trulicity (dulaglutide) Mounjaro (Tirzepatide) Adlyxin (Lixisenatide), Polyethylene Glycol Loxenatide.                              You may not have any metal on your body including hair pins, jewelry, and body piercing             Do not wear make-up, lotions, powders, perfumes/cologne, or deodorant  Do not wear nail polish including gel and S&S, artificial/acrylic nails, or any other type of covering on natural nails including finger and toenails. If you have artificial nails, gel coating, etc. that needs to be removed by a nail salon please have this removed prior to surgery or surgery may need to be canceled/ delayed if the surgeon/ anesthesia feels like they are unable to be safely monitored.   Do not shave  48 hours prior to surgery.    Do not bring valuables to the hospital. [facility] IS NOT             RESPONSIBLE   FOR VALUABLES.   Contacts, glasses, or bridgework may not be worn into surgery.   Bring small  overnight bag day of surgery.   DO NOT BRING YOUR HOME MEDICATIONS TO THE HOSPITAL. PHARMACY WILL DISPENSE MEDICATIONS LISTED ON YOUR MEDICATION LIST TO YOU DURING YOUR ADMISSION IN THE HOSPITAL!    Patients discharged on the day of surgery will not be allowed to drive home.  Someone NEEDS to stay with you for the  first 24 hours after anesthesia.   Special Instructions: Bring a copy of your healthcare power of attorney and living will documents         the day of surgery if you haven't scanned them before.              Please read over the following fact sheets you were given: IF YOU HAVE QUESTIONS ABOUT YOUR PRE-OP INSTRUCTIONS PLEASE CALL [phone]    [facility] - Preparing for Surgery Before surgery, you can play an important role.  Because skin is not sterile, your skin needs to be as free of germs as possible.  You can reduce the number of germs on your skin by washing with CHG (chlorahexidine gluconate) soap before surgery.  CHG is an antiseptic cleaner which kills germs and bonds with the skin to continue killing germs even after washing. Please DO NOT use if you have an allergy to CHG or antibacterial soaps.  If your skin becomes reddened/irritated stop using the CHG and inform your nurse when you arrive at Short Stay. Do not shave (including legs and underarms) for at least 48 hours prior to the first CHG shower.  You may shave your face/neck. Please follow these instructions carefully:  1.  Shower with CHG Soap the night before surgery and the  morning of Surgery.  2.  If you choose to wash your hair, wash your hair first as usual with your  normal  shampoo.  3.  After you shampoo, rinse your hair and body thoroughly to remove the  shampoo.                           4.  Use CHG as you would any other liquid soap.  You can apply chg directly  to the skin and wash                       Gently with a scrungie or clean washcloth.  5.  Apply the CHG Soap to your body ONLY FROM THE NECK DOWN.   Do not use on face/ open                           Wound or open sores. Avoid contact with eyes, ears mouth and genitals (private parts).                       Wash face,  Genitals (private parts) with your normal soap.             6.  Wash thoroughly, paying special attention to the area where your surgery   will be performed.  7.  Thoroughly rinse your body with warm water from the neck down.  8.  DO NOT shower/wash with your normal soap after using and rinsing off  the CHG Soap.                9.  Pat yourself dry with a clean towel.            10.  Wear clean pajamas.            11.  Place clean sheets on your bed the night of your first shower and do not  sleep with pets. Day of Surgery : Do not apply any lotions/deodorants the morning of surgery.  Please wear clean clothes to the hospital/surgery center.  FAILURE TO FOLLOW THESE INSTRUCTIONS MAY RESULT IN THE CANCELLATION OF YOUR SURGERY PATIENT SIGNATURE_________________________________  NURSE SIGNATURE__________________________________  ________________________________________________________________________ WHAT IS A BLOOD TRANSFUSION? Blood Transfusion Information  A transfusion is the replacement of blood or some of its parts. Blood is made up of multiple cells which provide different functions. Red blood cells carry oxygen and are used for blood loss replacement. White blood cells fight against infection. Platelets control bleeding. Plasma helps clot blood. Other blood products are available for specialized needs, such as hemophilia or other clotting disorders. BEFORE THE TRANSFUSION  Who gives blood for transfusions?  Healthy volunteers who are fully evaluated to make sure their blood is safe. This is blood bank blood. Transfusion therapy is the safest it has ever been in the practice of medicine. Before blood is taken from a donor, a complete history is taken to make sure that person has no history of diseases nor engages in risky social behavior (examples are intravenous drug use or sexual activity with multiple partners). The donor's travel history is screened to minimize risk of transmitting infections, such as malaria. The donated blood is tested for signs of infectious diseases, such as HIV and hepatitis. The blood is then  tested to be sure it is compatible with you in order to minimize the chance of a transfusion reaction. If you or a relative donates blood, this is often done in anticipation of surgery and is not appropriate for emergency situations. It takes many days to process the donated blood. RISKS AND COMPLICATIONS Although transfusion therapy is very safe and saves many lives, the main dangers of transfusion include:  Getting an infectious disease. Developing a transfusion reaction. This is an allergic reaction to something in the blood you were given. Every precaution is taken to prevent this. The decision to have a blood transfusion has been considered carefully by your caregiver before blood is given. Blood is not given unless the benefits outweigh the risks. AFTER THE TRANSFUSION Right after receiving a blood transfusion, you will usually feel much better and more energetic. This is especially true if your red blood cells have gotten low (anemic). The transfusion raises the level of the red blood cells which carry oxygen, and this usually causes an energy increase. The nurse administering the transfusion will monitor you carefully for complications. HOME CARE INSTRUCTIONS  No special instructions are needed after a transfusion. You may find your energy is better. Speak with your caregiver about any limitations on activity for underlying diseases you may have. SEEK MEDICAL CARE IF:  Your condition is not improving after your transfusion. You develop redness or irritation at the intravenous (IV) site. SEEK IMMEDIATE MEDICAL CARE IF:  Any of the following symptoms occur over the next 12 hours: Shaking chills. You have a temperature by mouth above 102 F (38.9 C), not controlled by medicine. Chest, back, or muscle pain. People around you feel you are not acting correctly or are confused. Shortness of breath or difficulty breathing. Dizziness and fainting. You get a rash or develop hives. You have a  decrease in urine output. Your urine turns a dark color or changes to pink, red, or brown. Any  of the following symptoms occur over the next 10 days: You have a temperature by mouth above 102 F (38.9 C), not controlled by medicine. Shortness of breath. Weakness after normal activity. The white part of the eye turns yellow (jaundice). You have a decrease in the amount of urine or are urinating less often. Your urine turns a dark color or changes to pink, red, or brown. Document Released: 03/29/2000 Document Revised: 06/24/2011 Document Reviewed: 11/16/2007 [facility] Patient Information 2014 [place], [place].  _______________________________________________________________________

## 2022-08-22 ENCOUNTER — Encounter (HOSPITAL_COMMUNITY)
Admission: RE | Admit: 2022-08-22 | Discharge: 2022-08-22 | Disposition: A | Discharge status: Home / Self Care | Payer: 59 | Source: Ambulatory Visit | Attending: Psychiatry | Admitting: Psychiatry

## 2022-08-22 ENCOUNTER — Telehealth: Payer: Self-pay | Admitting: Surgery

## 2022-08-22 ENCOUNTER — Other Ambulatory Visit: Payer: Self-pay

## 2022-08-22 ENCOUNTER — Encounter (HOSPITAL_COMMUNITY): Payer: Self-pay

## 2022-08-22 VITALS — BP 121/75 | HR 84 | Temp 98.9°F | Ht 62.0 in | Wt 219.0 lb

## 2022-08-22 DIAGNOSIS — E119 Type 2 diabetes mellitus without complications: Secondary | ICD-10-CM | POA: Insufficient documentation

## 2022-08-22 DIAGNOSIS — C541 Malignant neoplasm of endometrium: Secondary | ICD-10-CM | POA: Diagnosis present

## 2022-08-22 LAB — TYPE AND SCREEN
ABO/RH(D): O POS
Antibody Screen: NEGATIVE

## 2022-08-22 LAB — CBC
HCT: 36.3 % (ref 36.0–46.0)
Hemoglobin: 11.7 g/dL — ABNORMAL LOW (ref 12.0–15.0)
MCH: 28.7 pg (ref 26.0–34.0)
MCHC: 32.2 g/dL (ref 30.0–36.0)
MCV: 89 fL (ref 80.0–100.0)
Platelets: 442 10*3/uL — ABNORMAL HIGH (ref 150–400)
RBC: 4.08 MIL/uL (ref 3.87–5.11)
RDW: 13.1 % (ref 11.5–15.5)
WBC: 7.5 10*3/uL (ref 4.0–10.5)
nRBC: 0 % (ref 0.0–0.2)

## 2022-08-22 LAB — COMPREHENSIVE METABOLIC PANEL
ALT: 24 U/L (ref 0–44)
AST: 18 U/L (ref 15–41)
Albumin: 4.2 g/dL (ref 3.5–5.0)
Alkaline Phosphatase: 53 U/L (ref 38–126)
Anion gap: 7 (ref 5–15)
BUN: 28 mg/dL — ABNORMAL HIGH (ref 6–20)
CO2: 23 mmol/L (ref 22–32)
Calcium: 12.2 mg/dL — ABNORMAL HIGH (ref 8.9–10.3)
Chloride: 108 mmol/L (ref 98–111)
Creatinine, Ser: 1.31 mg/dL — ABNORMAL HIGH (ref 0.44–1.00)
GFR, Estimated: 49 mL/min — ABNORMAL LOW (ref >60)
Glucose, Bld: 94 mg/dL (ref 70–99)
Potassium: 4.2 mmol/L (ref 3.5–5.1)
Sodium: 138 mmol/L (ref 135–145)
Total Bilirubin: 0.3 mg/dL (ref 0.3–1.2)
Total Protein: 7.5 g/dL (ref 6.5–8.1)

## 2022-08-22 LAB — GLUCOSE, CAPILLARY: Glucose-Capillary: 98 mg/dL (ref 70–99)

## 2022-08-22 NOTE — Telephone Encounter (Signed)
Called patient to review lab results. LVM requesting patient call back asap.

## 2022-08-22 NOTE — Telephone Encounter (Signed)
Pt was called and message relayed from Warner Mccreedy, NP with lab results.  Pt understood that her calcium level was elevated and states that she saw her endocrinologist at the end of April who also told her that her calcium level was elevated. Pt denies taking any calcium supplements. Pt was encouraged to increase her fluid intake per endocrinologist because she was dehydrated.   Pt also states she has a follow up with her PCP Dr. Duanne Guess next Tuesday, May 14 th. Pt's labs, basic metabolic panel was routed and faxed to Dr. Duanne Guess and the office was called to see if there was an earlier appt. available for the patient. Dr. Chauncy Passy office staff said they would contact patient.   Pt was encouraged to continue staying well hydrated.

## 2022-08-22 NOTE — Telephone Encounter (Signed)
-----   Message from Doylene Bode, NP sent at 08/22/2022 12:53 PM EDT ----- Please route these labs to PCP. She needs to see her PCP asap like today or tomorrow given increasing kidney function and calcium level.   Please call the patient and let her know she needs to see her PCP as soon as able due to increasing kidney function and elevated calcium level. See if she is taking calcium. She needs to stay hydrated as well.  ----- Message ----- From: Interface, Lab In [place] Sent: 08/22/2022  12:18 PM EDT To: Doylene Bode, NP

## 2022-08-22 NOTE — Progress Notes (Addendum)
For Short Stay: COVID SWAB appointment date:  Bowel Prep reminder: N/A   For Anesthesia: PCP - Dr. Maryelizabeth Rowan Cardiologist - N/A  Chest x-ray -  EKG - 06/20/22 Stress Test -  ECHO - 2018 Cardiac Cath -  Pacemaker/ICD device last checked: Pacemaker orders received: Device Rep notified:  Spinal Cord Stimulator: N/A  Sleep Study - N/A CPAP -   Fasting Blood Sugar - 80's Checks Blood Sugar : Freestyle Date and result of last Hgb A1c- 7.3: 07/31/22: [facility]  Last dose of GLP1 agonist- N/A GLP1 instructions:   Last dose of SGLT-2 inhibitors- N/A SGLT-2 instructions:   Blood Thinner Instructions: No instructions yet for aspirin 325 mg,pt. Has an appointment with surgeon on 08/26/22 Aspirin Instructions: Last Dose:  Activity level: Can go up a flight of stairs and activities of daily living without stopping and without chest pain and/or shortness of breath   Able to exercise without chest pain and/or shortness of breath  Anesthesia review: Hx: HTN,DIA,Stroke,seizures (last one 2019)  Patient denies shortness of breath, fever, cough and chest pain at PAT appointment   Patient verbalized understanding of instructions that were given to them at the PAT appointment. Patient was also instructed that they will need to review over the PAT instructions again at home before surgery.

## 2022-08-23 NOTE — Telephone Encounter (Signed)
Thanks for the update

## 2022-08-26 ENCOUNTER — Encounter: Payer: Self-pay | Admitting: Psychiatry

## 2022-08-26 ENCOUNTER — Inpatient Hospital Stay (HOSPITAL_BASED_OUTPATIENT_CLINIC_OR_DEPARTMENT_OTHER): Payer: 59 | Admitting: Gynecologic Oncology

## 2022-08-26 ENCOUNTER — Inpatient Hospital Stay: Payer: 59

## 2022-08-26 ENCOUNTER — Inpatient Hospital Stay: Payer: 59 | Attending: Psychiatry | Admitting: Psychiatry

## 2022-08-26 ENCOUNTER — Other Ambulatory Visit: Payer: Self-pay

## 2022-08-26 VITALS — BP 121/71 | HR 91 | Temp 98.4°F | Resp 18 | Ht 62.99 in | Wt 221.6 lb

## 2022-08-26 DIAGNOSIS — Z7982 Long term (current) use of aspirin: Secondary | ICD-10-CM | POA: Diagnosis not present

## 2022-08-26 DIAGNOSIS — C541 Malignant neoplasm of endometrium: Secondary | ICD-10-CM

## 2022-08-26 DIAGNOSIS — Z79818 Long term (current) use of other agents affecting estrogen receptors and estrogen levels: Secondary | ICD-10-CM | POA: Insufficient documentation

## 2022-08-26 DIAGNOSIS — Z8673 Personal history of transient ischemic attack (TIA), and cerebral infarction without residual deficits: Secondary | ICD-10-CM | POA: Diagnosis not present

## 2022-08-26 DIAGNOSIS — N179 Acute kidney failure, unspecified: Secondary | ICD-10-CM

## 2022-08-26 DIAGNOSIS — E1169 Type 2 diabetes mellitus with other specified complication: Secondary | ICD-10-CM

## 2022-08-26 DIAGNOSIS — E119 Type 2 diabetes mellitus without complications: Secondary | ICD-10-CM | POA: Diagnosis not present

## 2022-08-26 DIAGNOSIS — Z7189 Other specified counseling: Secondary | ICD-10-CM

## 2022-08-26 DIAGNOSIS — Z7983 Long term (current) use of bisphosphonates: Secondary | ICD-10-CM | POA: Diagnosis not present

## 2022-08-26 LAB — CMP (CANCER CENTER ONLY)
ALT: 21 U/L (ref 0–44)
AST: 16 U/L (ref 15–41)
Albumin: 4.5 g/dL (ref 3.5–5.0)
Alkaline Phosphatase: 57 U/L (ref 38–126)
Anion gap: 9 (ref 5–15)
BUN: 21 mg/dL — ABNORMAL HIGH (ref 6–20)
CO2: 21 mmol/L — ABNORMAL LOW (ref 22–32)
Calcium: 12.1 mg/dL — ABNORMAL HIGH (ref 8.9–10.3)
Chloride: 110 mmol/L (ref 98–111)
Creatinine: 1.12 mg/dL — ABNORMAL HIGH (ref 0.44–1.00)
GFR, Estimated: 59 mL/min — ABNORMAL LOW (ref >60)
Glucose, Bld: 118 mg/dL — ABNORMAL HIGH (ref 70–99)
Potassium: 4.3 mmol/L (ref 3.5–5.1)
Sodium: 140 mmol/L (ref 135–145)
Total Bilirubin: 0.2 mg/dL — ABNORMAL LOW (ref 0.3–1.2)
Total Protein: 7.9 g/dL (ref 6.5–8.1)

## 2022-08-26 MED ORDER — SENNOSIDES-DOCUSATE SODIUM 8.6-50 MG PO TABS
2.0000 | ORAL_TABLET | Freq: Every day | ORAL | 0 refills | Status: DC
Start: 2022-08-26 — End: 2023-10-20

## 2022-08-26 MED ORDER — TRAMADOL HCL 50 MG PO TABS: 50.0000 mg | ORAL_TABLET | Freq: Two times a day (BID) | ORAL | 0 refills | Status: DC | PRN

## 2022-08-26 NOTE — Progress Notes (Signed)
Patient here for follow up with Dr. Alvester Morin and for a pre-operative appointment prior to her scheduled surgery on Sep 03, 2022. She is scheduled for robotic assisted total laparoscopic hysterectomy, bilateral salpingo-oophorectomy, sentinel lymph node biopsy, possible lymph node dissection, possible laparotomy.  She had her pre-admission testing appointment at [facility].  The surgery was discussed in detail.  See after visit summary for additional details.    Discussed post-op pain management in detail including the aspects of the enhanced recovery pathway.  Advised her that a new prescription would be sent in for tramadol and it is only to be used for after her upcoming surgery.  We discussed the use of tylenol post-op and to monitor for a maximum of 4,000 mg in a 24 hour period.  Also prescribed sennakot to be used after surgery and to hold if having loose stools.  Discussed bowel regimen in detail.     Discussed measures to take at home to prevent DVT including frequent mobility.  Reportable signs and symptoms of DVT discussed. Post-operative instructions discussed and expectations for after surgery. Incisional care discussed as well including reportable signs and symptoms including erythema, drainage, wound separation.     5 minutes spent with the patient.  Verbalizing understanding of material discussed. No needs or concerns voiced at the end of the visit.   Advised patient to call for any needs.  Advised that her post-operative medications had been prescribed and could be picked up at any time. Pt escorted to lab for repeat Cmet to follow up on calcium level.    This appointment is included in the global surgical bundle as pre-operative teaching and has no charge.

## 2022-08-26 NOTE — H&P (View-Only) (Signed)
Gynecologic Oncology Return Clinic Visit  Date of Service: 08/26/2022 Referring Provider: Shea Evans, MD   Assessment & Plan: Kelly Davies is a 52 y.o. woman with FIGO grade 1 endometrial cancer as well as uterine fibroids and poorly controlled T2DM, history of stroke in 2018 who is following up regarding treatment discussion of her endometrial cancer.  Endometrial cancer: - Pathology showed FIGO grade 1 endometrioid endometrial cancer. -Currently with medical management with Megace 80 mg twice daily. - Given that patient has been able to achieve A1c goal less than 8, can proceed with hysterectomy.  Patient can discontinue Megace following hysterectomy. - We reviewed recommended surgical staging, including total hysterectomy, bilateral salpingo-oophorectomy, and lymph node assessment. The patient is a suitable candidate for staging via a minimally invasive approach to surgery.  We reviewed that robotic assistance would be used to complete the surgery.  - We reviewed the sentinel lymph node technique. Risks and benefits of sentinel lymph node biopsy was reviewed. We reviewed the technique and ICG dye. The patient DOES NOT have an iodine allergy or known liver dysfunction. We reviewed the false negative rate (0.4%), and that 3% of patients with metastatic disease will not have it detected by SLN biopsy in endometrial cancer. A low risk of allergic reaction to the dye, <0.2% for ICG, has been reported. We also discussed that in the case of failed mapping, which occurs 40% of the time, a bilateral or unilateral lymphadenectomy will be performed at the surgeon's discretion.  - Potential benefits of sentinel nodes including a higher detection rate for metastasis due to ultrastaging and potential reduction in operative morbidity. However, there remains uncertainty as to the role for treatment of micrometastatic disease. Further, the benefit of operative morbidity associated with the SLN technique in  endometrial cancer is not yet completely known. In other patient populations (e.g. the cervical cancer population) there has been observed reductions in morbidity with SLN biopsy compared to pelvic lymphadenectomy. Lymphedema, nerve dysfunction and lymphocysts are all potential risks with the SLN technique as with complete lymphadenectomy. Additional risks to the patient include the risk of damage to an internal organ while operating in an altered view (e.g. the black and white image of the robotic fluorescence imaging mode).  - Patient was consented for: Robotic assisted total laparoscopic hysterectomy, bilateral salpingo-oophorectomy, sentinel lymph node evaluation and biopsy, possible lymph node dissection on 09/03/22. - The risks of surgery were discussed in detail and she understands these to including but not limited to bleeding requiring a blood transfusion, infection, injury to adjacent organs (including but not limited to the bowels, bladder, ureters, nerves, blood vessels), thromboembolic events, wound separation, hernia, vaginal cuff separation, possible risk of lymphedema and lymphocyst if lymphadenectomy performed, unforseen complication, possible need for re-exploration, medical complications such as heart attack, stroke, pneumonia, and anesthesia risk. - If the patient experiences any of these events, she understands that her hospitalization or recovery may be prolonged and that she may need to take additional medications for a prolonged period. The patient will receive DVT and antibiotic prophylaxis as indicated. She voiced a clear understanding. She had the opportunity to ask questions and informed consent was obtained today. She wishes to proceed. -Patient has been cleared by her endocrinologist given improved A1c.  Working to obtain clearance from her PCP given history of stroke.  She sees her PCP tomorrow.  Patient instructed to hold her aspirin 325 mg for 1 week prior to surgery unless PCP  states otherwise. - All preoperative instructions  were reviewed. Postoperative expectations were also reviewed. Written handouts were provided to the patient.  Poorly controlled diabetes: - A1c was 7.3 on 4/16/202 - Increase perioperative risk in the setting of diabetes.  However given at goal less than 8, okay to proceed with surgery as planned above.  Hypercalcemia of unknown etiology: -Patient has been undergoing a workup with her endocrinologist. - Repeat CMP today with persistent hypercalecmia at 12.1 but slight improvement in kidney function at 1.12. - Was able to speak with Dr. Roanna Raider following this patient's visit.  Additional lab work since the records were obtained were also notable for a negative SPEP. - Dr. Roanna Raider reviewed that differential still could include hyperparathyroidism or sarcoidosis.  Additional workup for these etiologies could be deferred until after surgery. -Or, in the setting of upcoming surgery and anesthesia, Dr. Roanna Raider would recommend preoperative treatment with IV pamidronate 60mg  infused over 6 hours.  Ideally this would occur the day before surgery.  We will try to see if we are able to get this arranged for patient while keeping surgery scheduled for next week. - I did warn patient that it is possible surgical date would have to be adjusted if additional workup or treatment was warranted prior to surgery.  We will update patient if we are able to get infusion scheduled as above. Addendum: after discussion with pharmacy, due to pt's insurance, pamidronate would require a prior-auth but zometa is approved. Will get pt set up for an infusion later this week. Plan to recheck calcium on Monday.   RTC postop  Clide Cliff, MD Gynecologic Oncology  Medical Decision Making I personally spent  TOTAL 40 minutes face-to-face and non-face-to-face in the care of this patient, which includes all pre, intra, and post visit time on the date of service.  2 minutes spent  reviewing records prior to the visit 30 Minutes in patient contact 8 minutes charting , conferring with consultants etc.   ----------------------- Reason for Visit: Postop/Treatment counseling  Treatment History: Oncology History  Endometrial cancer (HCC)  04/14/2022 Imaging   Pelvic ultrasound: IMPRESSION: Heterogeneous uterine fibroid with subsequently limited evaluation of the endometrium.   05/28/2022 Initial Biopsy   Endometrial biopsy: FIGO grade 1 endometrioid adenocarcinoma    06/25/2022 Initial Diagnosis   Endometrial cancer (HCC)   06/25/2022 Surgery   Dilation and curettage, Mirena intrauterine device insertion, vaginal biopsy     Pathology Results   A. ENDOMETRIUM, CURETTAGE:  - Endometrioid carcinoma, FIGO grade 1.  See comment   B. VAGINA, POSTERIOR, BIOPSY:  - Vaginal mucosa with no specific histopathologic changes  - Negative for dysplasia      Interval History: Patient presents today to discuss plan for possible hysterectomy.  She has been following closely with her endocrinologist Dr. Ocie Cornfield.  Her repeat A1c was 7.3 on 07/30/2022.  Of note, over the past few months, patient has been noted to have hypercalcemia of unclear etiology.  She has been undergoing a workup with her endocrinologist.  Labs available to [place] at the time of the visit were overall reassuring.  Patient reports that she has been trying to hydrate as instructed given the hypercalcemia and mild elevation in her creatinine.  Patient denies any new medications apart from the Megace.  She has follow-up with her PCP Dr. Maryelizabeth Rowan tomorrow.  She denies any vaginal bleeding.  She is taking the Megace without issue.  The IUD previously fell out.  She denies any chest pain or shortness of breath.  She is  currently taking aspirin 325 mg daily.  She reports that she started this following her history of stroke and no one has told her to stop so she continues to do this.  Past Medical/Surgical  History: Past Medical History:  Diagnosis Date   Anemia    Cancer (HCC)    Diabetes mellitus without complication (HCC)    Hypercholesteremia    Hypertension    Partial seizure (HCC)    Stroke (HCC)    TIA (transient ischemic attack)     Past Surgical History:  Procedure Laterality Date   COLONOSCOPY     DILATION AND CURETTAGE OF UTERUS N/A 06/25/2022   Procedure: DILATATION AND CURETTAGE, VAGINAL BIOPSY;  Surgeon: Clide Cliff, MD;  Location: [facility];  Service: Gynecology;  Laterality: N/A;   INTRAUTERINE DEVICE (IUD) INSERTION N/A 06/25/2022   Procedure: INTRAUTERINE DEVICE (IUD) INSERTION;  Surgeon: Clide Cliff, MD;  Location: [facility];  Service: Gynecology;  Laterality: N/A;   NO PAST SURGERIES     TEE WITHOUT CARDIOVERSION N/A 03/20/2017   Procedure: TRANSESOPHAGEAL ECHOCARDIOGRAM (TEE);  Surgeon: Yates Decamp, MD;  Location: [facility] ENDOSCOPY;  Service: Cardiovascular;  Laterality: N/A;    Family History  Problem Relation Age of Onset   Colon cancer Maternal Uncle        60s   Endometrial cancer Neg Hx    Breast cancer Neg Hx    Ovarian cancer Neg Hx     Social History   Socioeconomic History   Marital status: Single    Spouse name: Not on file   Number of children: Not on file   Years of education: Not on file   Highest education level: Not on file  Occupational History   Not on file  Tobacco Use   Smoking status: Never   Smokeless tobacco: Never  Vaping Use   Vaping Use: Never used  Substance and Sexual Activity   Alcohol use: No   Drug use: No   Sexual activity: Not Currently  Other Topics Concern   Not on file  Social History Narrative   Not on file   Social Determinants of Health   Financial Resource Strain: Not on file  Food Insecurity: No Food Insecurity (06/13/2022)   Hunger Vital Sign    Worried About Running Out of Food in the Last Year: Never true    Ran Out of Food in the Last Year: Never true  Transportation Needs: No Transportation  Needs (06/13/2022)   PRAPARE - Administrator, Civil Service (Medical): No    Lack of Transportation (Non-Medical): No  Physical Activity: Not on file  Stress: Not on file  Social Connections: Not on file    Current Medications:  Current Outpatient Medications:    aspirin 325 MG tablet, Take 1 tablet (325 mg total) by mouth daily., Disp: 30 tablet, Rfl: 0   cetirizine (ZYRTEC) 10 MG tablet, Take 10 mg by mouth daily., Disp: , Rfl:    Cholecalciferol (VITAMIN D-3) 125 MCG (5000 UT) TABS, Take 5,000 Units by mouth daily., Disp: , Rfl:    folic acid (FOLVITE) 1 MG tablet, Take 1 mg by mouth daily., Disp: , Rfl:    glimepiride (AMARYL) 4 MG tablet, Take 4 mg by mouth daily with breakfast., Disp: , Rfl:    ibuprofen (ADVIL) 200 MG tablet, Take 600 mg by mouth as needed for moderate pain., Disp: , Rfl:    Iron-FA-B Cmp-C-Biot-Probiotic (FUSION PLUS PO), Take 1 capsule by mouth daily., Disp: , Rfl:  JANUVIA 100 MG tablet, Take 100 mg by mouth daily., Disp: , Rfl:    levETIRAcetam (KEPPRA) 500 MG tablet, Take 1 tablet (500 mg total) by mouth 2 (two) times daily. Must be seen for further refills. Call [phone] to make an appointment. (Patient taking differently: Take 500 mg by mouth daily.), Disp: 60 tablet, Rfl: 0   megestrol (MEGACE) 40 MG tablet, Take 2 tablets (80 mg total) by mouth 2 (two) times daily., Disp: 120 tablet, Rfl: 0   montelukast (SINGULAIR) 10 MG tablet, Take 10 mg by mouth at bedtime., Disp: , Rfl:    Polyethyl Glycol-Propyl Glycol (SYSTANE OP), Place 1 drop into both eyes as needed (dry eye)., Disp: , Rfl:    rosuvastatin (CRESTOR) 20 MG tablet, Take 20 mg by mouth daily., Disp: , Rfl:    TOUJEO SOLOSTAR 300 UNIT/ML Solostar Pen, Inject 20 Units into the skin as needed (high BS)., Disp: , Rfl:    valsartan-hydrochlorothiazide (DIOVAN-HCT) 80-12.5 MG tablet, Take 1 tablet by mouth daily., Disp: , Rfl:   Review of Symptoms: Complete 10-system review is positive  for: Fatigue  Physical Exam: BP 121/71 (BP Location: Left Arm, Patient Position: Sitting)   Pulse 91   Temp 98.4 F (36.9 C) (Oral)   Resp 18   Ht 5' 2.99" (1.6 m)   Wt 221 lb 9.6 oz (100.5 kg)   LMP 07/09/2022   SpO2 100%   BMI 39.26 kg/m  General: Alert, oriented, no acute distress. HEENT: Normocephalic, atraumatic. Neck symmetric without masses. Sclera anicteric.  Chest: Normal work of breathing. Clear to auscultation bilaterally.   Cardiovascular: Regular rate and rhythm, no murmurs. Abdomen: Soft, nontender.  Normoactive bowel sounds.   Extremities: Grossly normal range of motion.  Warm, well perfused.  No edema bilaterally. Skin: No rashes or lesions noted.   Laboratory & Radiologic Studies: Lab Results  Component Value Date   NA 140 08/26/2022   K 4.3 08/26/2022   CL 110 08/26/2022   CO2 21 (L) 08/26/2022   ANIONGAP 9 08/26/2022   BUN 21 (H) 08/26/2022   CREATININE 1.12 (H) 08/26/2022   CALCIUM 12.1 (H) 08/26/2022   ALBUMIN 4.5 08/26/2022   PROT 7.9 08/26/2022   BILITOT 0.2 (L) 08/26/2022   AST 16 08/26/2022   ALT 21 08/26/2022   ALKPHOS 57 08/26/2022   Lab Results  Component Value Date   WBC 7.5 08/22/2022   HGB 11.7 (L) 08/22/2022   HCT 36.3 08/22/2022   PLT 442 (H) 08/22/2022   NEUTROABS 4.7 04/13/2022     FINAL MICROSCOPIC DIAGNOSIS:   A. ENDOMETRIUM, CURETTAGE:  - Endometrioid carcinoma, FIGO grade 1.  See comment   B. VAGINA, POSTERIOR, BIOPSY:  - Vaginal mucosa with no specific histopathologic changes  - Negative for dysplasia

## 2022-08-26 NOTE — Patient Instructions (Addendum)
Preparing for your Surgery  Plan for surgery on Sep 03, 2022 with Dr. Clide Cliff at [facility]. You will be scheduled for robotic assisted total laparoscopic hysterectomy (removal of the uterus and cervix), bilateral salpingo-oophorectomy (removal of both ovaries and fallopian tubes), sentinel lymph node biopsy, possible lymph node dissection, possible laparotomy (larger incision on your abdomen if needed).   Pre-operative Testing -You will receive a phone call from presurgical testing at [facility] to arrange for a pre-operative appointment and lab work.  -Bring your insurance card, copy of an advanced directive if applicable, medication list  -At that visit, you will be asked to sign a consent for a possible blood transfusion in case a transfusion becomes necessary during surgery.  The need for a blood transfusion is rare but having consent is a necessary part of your care.     -WE WILL REACH OUT TO YOUR PCP ABOUT WHEN TO STOP TAKING YOUR ASPIRIN.  -Do not take supplements such as fish oil (omega 3), red yeast rice, turmeric before your surgery. You want to avoid medications with aspirin in them including headache powders such as BC or Goody's), Excedrin migraine.  Day Before Surgery at Home -You will be asked to take in a light diet the day before surgery. You will be advised you can have clear liquids up until 3 hours before your surgery.    Eat a light diet the day before surgery.  Examples including soups, broths, toast, yogurt, mashed potatoes.  AVOID GAS PRODUCING FOODS AND BEVERAGES. Things to avoid include carbonated beverages (fizzy beverages, sodas), raw fruits and raw vegetables (uncooked), or beans.   If your bowels are filled with gas, your surgeon will have difficulty visualizing your pelvic organs which increases your surgical risks.  Your role in recovery Your role is to become active as soon as directed by your doctor, while still giving yourself  time to heal.  Rest when you feel tired. You will be asked to do the following in order to speed your recovery:  - Cough and breathe deeply. This helps to clear and expand your lungs and can prevent pneumonia after surgery.  - STAY ACTIVE WHEN YOU GET HOME. Do mild physical activity. Walking or moving your legs help your circulation and body functions return to normal. Do not try to get up or walk alone the first time after surgery.   -If you develop swelling on one leg or the other, pain in the back of your leg, redness/warmth in one of your legs, please call the office or go to the Emergency Room to have a doppler to rule out a blood clot. For shortness of breath, chest pain-seek care in the Emergency Room as soon as possible. - Actively manage your pain. Managing your pain lets you move in comfort. We will ask you to rate your pain on a scale of zero to 10. It is your responsibility to tell your doctor or nurse where and how much you hurt so your pain can be treated.  Special Considerations -If you are diabetic, you may be placed on insulin after surgery to have closer control over your blood sugars to promote healing and recovery.  This does not mean that you will be discharged on insulin.  If applicable, your oral antidiabetics will be resumed when you are tolerating a solid diet.  -Your final pathology results from surgery should be available around one week after surgery and the results will be relayed to you when  available.  -Dr. Antionette Char is the surgeon that assists your GYN Oncologist with surgery.  If you end up staying the night, the next day after your surgery you will either see Dr. Pricilla Holm, Dr. Alvester Morin, or Dr. Antionette Char.  -FMLA forms can be faxed to [phone] and please allow 5-7 business days for completion.  Pain Management After Surgery -You have been prescribed your pain medication and bowel regimen medications before surgery so that you can have these available  when you are discharged from the hospital. The pain medication is for use ONLY AFTER surgery and a new prescription will not be given.   -Make sure that you have Tylenol IF YOU ARE ABLE TO TAKE THESE MEDICATION at home to use on a regular basis after surgery for pain control.   -Review the attached handout on narcotic use and their risks and side effects.   Bowel Regimen -You have been prescribed Sennakot-S to take nightly to prevent constipation especially if you are taking the narcotic pain medication intermittently.  It is important to prevent constipation and drink adequate amounts of liquids. You can stop taking this medication when you are not taking pain medication and you are back on your normal bowel routine.  Risks of Surgery Risks of surgery are low but include bleeding, infection, damage to surrounding structures, re-operation, blood clots, and very rarely death.   Blood Transfusion Information (For the consent to be signed before surgery)  We will be checking your blood type before surgery so in case of emergencies, we will know what type of blood you would need.                                            WHAT IS A BLOOD TRANSFUSION?  A transfusion is the replacement of blood or some of its parts. Blood is made up of multiple cells which provide different functions. Red blood cells carry oxygen and are used for blood loss replacement. White blood cells fight against infection. Platelets control bleeding. Plasma helps clot blood. Other blood products are available for specialized needs, such as hemophilia or other clotting disorders. BEFORE THE TRANSFUSION  Who gives blood for transfusions?  You may be able to donate blood to be used at a later date on yourself (autologous donation). Relatives can be asked to donate blood. This is generally not any safer than if you have received blood from a stranger. The same precautions are taken to ensure safety when a relative's blood is  donated. Healthy volunteers who are fully evaluated to make sure their blood is safe. This is blood bank blood. Transfusion therapy is the safest it has ever been in the practice of medicine. Before blood is taken from a donor, a complete history is taken to make sure that person has no history of diseases nor engages in risky social behavior (examples are intravenous drug use or sexual activity with multiple partners). The donor's travel history is screened to minimize risk of transmitting infections, such as malaria. The donated blood is tested for signs of infectious diseases, such as HIV and hepatitis. The blood is then tested to be sure it is compatible with you in order to minimize the chance of a transfusion reaction. If you or a relative donates blood, this is often done in anticipation of surgery and is not appropriate for emergency situations. It takes many days  to process the donated blood. RISKS AND COMPLICATIONS Although transfusion therapy is very safe and saves many lives, the main dangers of transfusion include:  Getting an infectious disease. Developing a transfusion reaction. This is an allergic reaction to something in the blood you were given. Every precaution is taken to prevent this. The decision to have a blood transfusion has been considered carefully by your caregiver before blood is given. Blood is not given unless the benefits outweigh the risks.  AFTER SURGERY INSTRUCTIONS  Return to work: 4-6 weeks if applicable  Activity: 1. Be up and out of the bed during the day.  Take a nap if needed.  You may walk up steps but be careful and use the hand rail.  Stair climbing will tire you more than you think, you may need to stop part way and rest.   2. No lifting or straining for 6 weeks over 10 pounds. No pushing, pulling, straining for 6 weeks.  3. No driving for around 1 week(s).  Do not drive if you are taking narcotic pain medicine and make sure that your reaction time has  returned.   4. You can shower as soon as the next day after surgery. Shower daily.  Use your regular soap and water (not directly on the incision) and pat your incision(s) dry afterwards; don't rub.  No tub baths or submerging your body in water until cleared by your surgeon. If you have the soap that was given to you by pre-surgical testing that was used before surgery, you do not need to use it afterwards because this can irritate your incisions.   5. No sexual activity and nothing in the vagina for 10-12 weeks.  6. You may experience a small amount of clear drainage from your incisions, which is normal.  If the drainage persists, increases, or changes color please call the office.  7. Do not use creams, lotions, or ointments such as neosporin on your incisions after surgery until advised by your surgeon because they can cause removal of the dermabond glue on your incisions.    8. You may experience vaginal spotting after surgery or around the 6-8 week mark from surgery when the stitches at the top of the vagina begin to dissolve.  The spotting is normal but if you experience heavy bleeding, call our office.  9. Take Tylenol first for pain if you are able to take these medication and only use narcotic pain medication for severe pain not relieved by the Tylenol.  Monitor your Tylenol intake to a max of 4,000 mg in a 24 hour period.   Diet: 1. Low sodium Heart Healthy Diet is recommended but you are cleared to resume your normal (before surgery) diet after your procedure.  2. It is safe to use a laxative, such as Miralax or Colace, if you have difficulty moving your bowels. You have been prescribed Sennakot-S to take at bedtime every evening after surgery to keep bowel movements regular and to prevent constipation.    Wound Care: 1. Keep clean and dry.  Shower daily.  Reasons to call the Doctor: Fever - Oral temperature greater than 100.4 degrees Fahrenheit Foul-smelling vaginal  discharge Difficulty urinating Nausea and vomiting Increased pain at the site of the incision that is unrelieved with pain medicine. Difficulty breathing with or without chest pain New calf pain especially if only on one side Sudden, continuing increased vaginal bleeding with or without clots.   Contacts: For questions or concerns you should contact:  Dr. Clide Cliff at [phone]  Warner Mccreedy, NP at [phone]  After Hours: call [phone] and have the GYN Oncologist paged/contacted (after 5 pm or on the weekends). You will speak with an after hours RN and let he or she know you have had surgery.  Messages sent via mychart are for non-urgent matters and are not responded to after hours so for urgent needs, please call the after hours number.

## 2022-08-26 NOTE — Progress Notes (Addendum)
Gynecologic Oncology Return Clinic Visit  Date of Service: 08/26/2022 Referring Provider: Shea Evans, MD   Assessment & Plan: Kelly Davies is a 52 y.o. woman with FIGO grade 1 endometrial cancer as well as uterine fibroids and poorly controlled T2DM, history of stroke in 2018 who is following up regarding treatment discussion of her endometrial cancer.  Endometrial cancer: - Pathology showed FIGO grade 1 endometrioid endometrial cancer. -Currently with medical management with Megace 80 mg twice daily. - Given that patient has been able to achieve A1c goal less than 8, can proceed with hysterectomy.  Patient can discontinue Megace following hysterectomy. - We reviewed recommended surgical staging, including total hysterectomy, bilateral salpingo-oophorectomy, and lymph node assessment. The patient is a suitable candidate for staging via a minimally invasive approach to surgery.  We reviewed that robotic assistance would be used to complete the surgery.  - We reviewed the sentinel lymph node technique. Risks and benefits of sentinel lymph node biopsy was reviewed. We reviewed the technique and ICG dye. The patient DOES NOT have an iodine allergy or known liver dysfunction. We reviewed the false negative rate (0.4%), and that 3% of patients with metastatic disease will not have it detected by SLN biopsy in endometrial cancer. A low risk of allergic reaction to the dye, <0.2% for ICG, has been reported. We also discussed that in the case of failed mapping, which occurs 40% of the time, a bilateral or unilateral lymphadenectomy will be performed at the surgeon's discretion.  - Potential benefits of sentinel nodes including a higher detection rate for metastasis due to ultrastaging and potential reduction in operative morbidity. However, there remains uncertainty as to the role for treatment of micrometastatic disease. Further, the benefit of operative morbidity associated with the SLN technique in  endometrial cancer is not yet completely known. In other patient populations (e.g. the cervical cancer population) there has been observed reductions in morbidity with SLN biopsy compared to pelvic lymphadenectomy. Lymphedema, nerve dysfunction and lymphocysts are all potential risks with the SLN technique as with complete lymphadenectomy. Additional risks to the patient include the risk of damage to an internal organ while operating in an altered view (e.g. the black and white image of the robotic fluorescence imaging mode).  - Patient was consented for: Robotic assisted total laparoscopic hysterectomy, bilateral salpingo-oophorectomy, sentinel lymph node evaluation and biopsy, possible lymph node dissection on 09/03/22. - The risks of surgery were discussed in detail and she understands these to including but not limited to bleeding requiring a blood transfusion, infection, injury to adjacent organs (including but not limited to the bowels, bladder, ureters, nerves, blood vessels), thromboembolic events, wound separation, hernia, vaginal cuff separation, possible risk of lymphedema and lymphocyst if lymphadenectomy performed, unforseen complication, possible need for re-exploration, medical complications such as heart attack, stroke, pneumonia, and anesthesia risk. - If the patient experiences any of these events, she understands that her hospitalization or recovery may be prolonged and that she may need to take additional medications for a prolonged period. The patient will receive DVT and antibiotic prophylaxis as indicated. She voiced a clear understanding. She had the opportunity to ask questions and informed consent was obtained today. She wishes to proceed. -Patient has been cleared by her endocrinologist given improved A1c.  Working to obtain clearance from her PCP given history of stroke.  She sees her PCP tomorrow.  Patient instructed to hold her aspirin 325 mg for 1 week prior to surgery unless PCP  states otherwise. - All preoperative instructions  were reviewed. Postoperative expectations were also reviewed. Written handouts were provided to the patient.  Poorly controlled diabetes: - A1c was 7.3 on 4/16/202 - Increase perioperative risk in the setting of diabetes.  However given at goal less than 8, okay to proceed with surgery as planned above.  Hypercalcemia of unknown etiology: -Patient has been undergoing a workup with her endocrinologist. - Repeat CMP today with persistent hypercalecmia at 12.1 but slight improvement in kidney function at 1.12. - Was able to speak with Dr. Roanna Raider following this patient's visit.  Additional lab work since the records were obtained were also notable for a negative SPEP. - Dr. Roanna Raider reviewed that differential still could include hyperparathyroidism or sarcoidosis.  Additional workup for these etiologies could be deferred until after surgery. -Or, in the setting of upcoming surgery and anesthesia, Dr. Roanna Raider would recommend preoperative treatment with IV pamidronate 60mg  infused over 6 hours.  Ideally this would occur the day before surgery.  We will try to see if we are able to get this arranged for patient while keeping surgery scheduled for next week. - I did warn patient that it is possible surgical date would have to be adjusted if additional workup or treatment was warranted prior to surgery.  We will update patient if we are able to get infusion scheduled as above. Addendum: after discussion with pharmacy, due to pt's insurance, pamidronate would require a prior-auth but zometa is approved. Will get pt set up for an infusion later this week. Plan to recheck calcium on Monday.   RTC postop  Clide Cliff, MD Gynecologic Oncology  Medical Decision Making I personally spent  TOTAL 40 minutes face-to-face and non-face-to-face in the care of this patient, which includes all pre, intra, and post visit time on the date of service.  2 minutes spent  reviewing records prior to the visit 30 Minutes in patient contact 8 minutes charting , conferring with consultants etc.   ----------------------- Reason for Visit: Postop/Treatment counseling  Treatment History: Oncology History  Endometrial cancer (HCC)  04/14/2022 Imaging   Pelvic ultrasound: IMPRESSION: Heterogeneous uterine fibroid with subsequently limited evaluation of the endometrium.   05/28/2022 Initial Biopsy   Endometrial biopsy: FIGO grade 1 endometrioid adenocarcinoma    06/25/2022 Initial Diagnosis   Endometrial cancer (HCC)   06/25/2022 Surgery   Dilation and curettage, Mirena intrauterine device insertion, vaginal biopsy     Pathology Results   A. ENDOMETRIUM, CURETTAGE:  - Endometrioid carcinoma, FIGO grade 1.  See comment   B. VAGINA, POSTERIOR, BIOPSY:  - Vaginal mucosa with no specific histopathologic changes  - Negative for dysplasia      Interval History: Patient presents today to discuss plan for possible hysterectomy.  She has been following closely with her endocrinologist Dr. Ocie Cornfield.  Her repeat A1c was 7.3 on 07/30/2022.  Of note, over the past few months, patient has been noted to have hypercalcemia of unclear etiology.  She has been undergoing a workup with her endocrinologist.  Labs available to [place] at the time of the visit were overall reassuring.  Patient reports that she has been trying to hydrate as instructed given the hypercalcemia and mild elevation in her creatinine.  Patient denies any new medications apart from the Megace.  She has follow-up with her PCP Dr. Maryelizabeth Rowan tomorrow.  She denies any vaginal bleeding.  She is taking the Megace without issue.  The IUD previously fell out.  She denies any chest pain or shortness of breath.  She is  currently taking aspirin 325 mg daily.  She reports that she started this following her history of stroke and no one has told her to stop so she continues to do this.  Past Medical/Surgical  History: Past Medical History:  Diagnosis Date   Anemia    Cancer (HCC)    Diabetes mellitus without complication (HCC)    Hypercholesteremia    Hypertension    Partial seizure (HCC)    Stroke (HCC)    TIA (transient ischemic attack)     Past Surgical History:  Procedure Laterality Date   COLONOSCOPY     DILATION AND CURETTAGE OF UTERUS N/A 06/25/2022   Procedure: DILATATION AND CURETTAGE, VAGINAL BIOPSY;  Surgeon: Clide Cliff, MD;  Location: [facility];  Service: Gynecology;  Laterality: N/A;   INTRAUTERINE DEVICE (IUD) INSERTION N/A 06/25/2022   Procedure: INTRAUTERINE DEVICE (IUD) INSERTION;  Surgeon: Clide Cliff, MD;  Location: [facility];  Service: Gynecology;  Laterality: N/A;   NO PAST SURGERIES     TEE WITHOUT CARDIOVERSION N/A 03/20/2017   Procedure: TRANSESOPHAGEAL ECHOCARDIOGRAM (TEE);  Surgeon: Yates Decamp, MD;  Location: [facility] ENDOSCOPY;  Service: Cardiovascular;  Laterality: N/A;    Family History  Problem Relation Age of Onset   Colon cancer Maternal Uncle        60s   Endometrial cancer Neg Hx    Breast cancer Neg Hx    Ovarian cancer Neg Hx     Social History   Socioeconomic History   Marital status: Single    Spouse name: Not on file   Number of children: Not on file   Years of education: Not on file   Highest education level: Not on file  Occupational History   Not on file  Tobacco Use   Smoking status: Never   Smokeless tobacco: Never  Vaping Use   Vaping Use: Never used  Substance and Sexual Activity   Alcohol use: No   Drug use: No   Sexual activity: Not Currently  Other Topics Concern   Not on file  Social History Narrative   Not on file   Social Determinants of Health   Financial Resource Strain: Not on file  Food Insecurity: No Food Insecurity (06/13/2022)   Hunger Vital Sign    Worried About Running Out of Food in the Last Year: Never true    Ran Out of Food in the Last Year: Never true  Transportation Needs: No Transportation  Needs (06/13/2022)   PRAPARE - Administrator, Civil Service (Medical): No    Lack of Transportation (Non-Medical): No  Physical Activity: Not on file  Stress: Not on file  Social Connections: Not on file    Current Medications:  Current Outpatient Medications:    aspirin 325 MG tablet, Take 1 tablet (325 mg total) by mouth daily., Disp: 30 tablet, Rfl: 0   cetirizine (ZYRTEC) 10 MG tablet, Take 10 mg by mouth daily., Disp: , Rfl:    Cholecalciferol (VITAMIN D-3) 125 MCG (5000 UT) TABS, Take 5,000 Units by mouth daily., Disp: , Rfl:    folic acid (FOLVITE) 1 MG tablet, Take 1 mg by mouth daily., Disp: , Rfl:    glimepiride (AMARYL) 4 MG tablet, Take 4 mg by mouth daily with breakfast., Disp: , Rfl:    ibuprofen (ADVIL) 200 MG tablet, Take 600 mg by mouth as needed for moderate pain., Disp: , Rfl:    Iron-FA-B Cmp-C-Biot-Probiotic (FUSION PLUS PO), Take 1 capsule by mouth daily., Disp: , Rfl:  JANUVIA 100 MG tablet, Take 100 mg by mouth daily., Disp: , Rfl:    levETIRAcetam (KEPPRA) 500 MG tablet, Take 1 tablet (500 mg total) by mouth 2 (two) times daily. Must be seen for further refills. Call [phone] to make an appointment. (Patient taking differently: Take 500 mg by mouth daily.), Disp: 60 tablet, Rfl: 0   megestrol (MEGACE) 40 MG tablet, Take 2 tablets (80 mg total) by mouth 2 (two) times daily., Disp: 120 tablet, Rfl: 0   montelukast (SINGULAIR) 10 MG tablet, Take 10 mg by mouth at bedtime., Disp: , Rfl:    Polyethyl Glycol-Propyl Glycol (SYSTANE OP), Place 1 drop into both eyes as needed (dry eye)., Disp: , Rfl:    rosuvastatin (CRESTOR) 20 MG tablet, Take 20 mg by mouth daily., Disp: , Rfl:    TOUJEO SOLOSTAR 300 UNIT/ML Solostar Pen, Inject 20 Units into the skin as needed (high BS)., Disp: , Rfl:    valsartan-hydrochlorothiazide (DIOVAN-HCT) 80-12.5 MG tablet, Take 1 tablet by mouth daily., Disp: , Rfl:   Review of Symptoms: Complete 10-system review is positive  for: Fatigue  Physical Exam: BP 121/71 (BP Location: Left Arm, Patient Position: Sitting)   Pulse 91   Temp 98.4 F (36.9 C) (Oral)   Resp 18   Ht 5' 2.99" (1.6 m)   Wt 221 lb 9.6 oz (100.5 kg)   LMP 07/09/2022   SpO2 100%   BMI 39.26 kg/m  General: Alert, oriented, no acute distress. HEENT: Normocephalic, atraumatic. Neck symmetric without masses. Sclera anicteric.  Chest: Normal work of breathing. Clear to auscultation bilaterally.   Cardiovascular: Regular rate and rhythm, no murmurs. Abdomen: Soft, nontender.  Normoactive bowel sounds.   Extremities: Grossly normal range of motion.  Warm, well perfused.  No edema bilaterally. Skin: No rashes or lesions noted.   Laboratory & Radiologic Studies: Lab Results  Component Value Date   NA 140 08/26/2022   K 4.3 08/26/2022   CL 110 08/26/2022   CO2 21 (L) 08/26/2022   ANIONGAP 9 08/26/2022   BUN 21 (H) 08/26/2022   CREATININE 1.12 (H) 08/26/2022   CALCIUM 12.1 (H) 08/26/2022   ALBUMIN 4.5 08/26/2022   PROT 7.9 08/26/2022   BILITOT 0.2 (L) 08/26/2022   AST 16 08/26/2022   ALT 21 08/26/2022   ALKPHOS 57 08/26/2022   Lab Results  Component Value Date   WBC 7.5 08/22/2022   HGB 11.7 (L) 08/22/2022   HCT 36.3 08/22/2022   PLT 442 (H) 08/22/2022   NEUTROABS 4.7 04/13/2022     FINAL MICROSCOPIC DIAGNOSIS:   A. ENDOMETRIUM, CURETTAGE:  - Endometrioid carcinoma, FIGO grade 1.  See comment   B. VAGINA, POSTERIOR, BIOPSY:  - Vaginal mucosa with no specific histopathologic changes  - Negative for dysplasia

## 2022-08-27 ENCOUNTER — Other Ambulatory Visit: Payer: 59

## 2022-08-27 ENCOUNTER — Telehealth: Payer: Self-pay | Admitting: Oncology

## 2022-08-27 ENCOUNTER — Other Ambulatory Visit: Payer: Self-pay | Admitting: Gynecologic Oncology

## 2022-08-27 NOTE — Telephone Encounter (Signed)
Left a message regarding appointments for 08/30/22 and 09/02/22.  Requested a return call to confirm.

## 2022-08-28 ENCOUNTER — Encounter: Payer: Self-pay | Admitting: Psychiatry

## 2022-08-28 NOTE — Telephone Encounter (Signed)
Called [facility] and confirmed infusion appointment on 08/30/22 and lab appointment on 09/02/2022.

## 2022-08-29 NOTE — Telephone Encounter (Signed)
Called Dr  Chauncy Passy office (PCP) regarding the surgical optimization form. Per the office; patient was seen and cleared for surgery. Form was given to the patient.   Called and [facility] for the patient to call the office back.

## 2022-08-29 NOTE — Telephone Encounter (Signed)
Patient called back and will bring form today to her infusion appt

## 2022-08-30 ENCOUNTER — Encounter: Payer: Self-pay | Admitting: Psychiatry

## 2022-08-30 ENCOUNTER — Encounter: Payer: Self-pay | Admitting: Gynecologic Oncology

## 2022-08-30 ENCOUNTER — Inpatient Hospital Stay: Payer: 59

## 2022-08-30 DIAGNOSIS — C541 Malignant neoplasm of endometrium: Secondary | ICD-10-CM | POA: Diagnosis not present

## 2022-08-30 MED ORDER — SODIUM CHLORIDE 0.9 % IV SOLN: Freq: Once | INTRAVENOUS | Status: AC

## 2022-08-30 MED ORDER — ZOLEDRONIC ACID 4 MG/100ML IV SOLN
4.0000 mg | Freq: Once | INTRAVENOUS | Status: AC
  Administered 2022-08-30: 4 mg via INTRAVENOUS
  Filled 2022-08-30: qty 100

## 2022-08-30 NOTE — Patient Instructions (Signed)

## 2022-08-30 NOTE — Progress Notes (Signed)
Zometa has been ordered per Dr. Alvester Morin through the recommendation from the patient's endocrinologist for a one time dose before surgery to assist with treatment of hypercalcemia.   The patient is able to receive this infusion today per Dr. Alvester Morin.

## 2022-08-30 NOTE — Telephone Encounter (Signed)
Received medical clearance and pt instructed to stop ASA on 5/16

## 2022-09-02 ENCOUNTER — Inpatient Hospital Stay: Payer: 59

## 2022-09-02 ENCOUNTER — Telehealth: Payer: Self-pay | Admitting: *Deleted

## 2022-09-02 ENCOUNTER — Other Ambulatory Visit: Payer: Self-pay | Admitting: Gynecologic Oncology

## 2022-09-02 DIAGNOSIS — C541 Malignant neoplasm of endometrium: Secondary | ICD-10-CM | POA: Diagnosis not present

## 2022-09-02 LAB — BASIC METABOLIC PANEL
Anion gap: 8 (ref 5–15)
BUN: 34 mg/dL — ABNORMAL HIGH (ref 6–20)
CO2: 21 mmol/L — ABNORMAL LOW (ref 22–32)
Calcium: 12.3 mg/dL — ABNORMAL HIGH (ref 8.9–10.3)
Chloride: 107 mmol/L (ref 98–111)
Creatinine, Ser: 1.77 mg/dL — ABNORMAL HIGH (ref 0.44–1.00)
GFR, Estimated: 34 mL/min — ABNORMAL LOW (ref >60)
Glucose, Bld: 128 mg/dL — ABNORMAL HIGH (ref 70–99)
Potassium: 4.4 mmol/L (ref 3.5–5.1)
Sodium: 136 mmol/L (ref 135–145)

## 2022-09-02 NOTE — Telephone Encounter (Signed)
Kelly Davies was notified of her lab results, see message from Warner Mccreedy, NP.  Pt states she had a reaction to the Zometa she received on Friday.  States her symptoms included fever, chills, muscle aches, nausea, and decreased appetite. Pt also states she slept all day on Saturday and her symptoms subsided Sunday evening.  Pt was encouraged to increase her fluid intake and the importance of keeping hydrated.   Lab results faxed to Pt's PCP Dr. Maryelizabeth Rowan.  Pt received anesthesia clearance for Surgery and they would be possibly calling patient today for her pre-op instructions.   Also spoke with patient to check on pre-operative status.  Patient compliant with pre-operative instructions.  Reinforced nothing to eat after midnight. Clear liquids until 0645. Patient to arrive at 0745.  No questions or concerns voiced.  Instructed to call for any needs.

## 2022-09-02 NOTE — Telephone Encounter (Signed)
-----   Message from Doylene Bode, NP sent at 09/02/2022  9:44 AM EDT ----- Please reach out to the patient and let her know her calcium is slightly higher than the last check, currently at 12.3. Her creatinine has increased as well to 1.77. Please fax the results to her PCP.  I have reached out to anesthesia to see if we can proceed with surgery tomorrow based on that calcium level. We will let her know what they say.

## 2022-09-02 NOTE — Telephone Encounter (Signed)
-----   Message from Melissa D Cross, NP sent at 09/02/2022  9:44 AM EDT ----- Please reach out to the patient and let her know her calcium is slightly higher than the last check, currently at 12.3. Her creatinine has increased as well to 1.77. Please fax the results to her PCP.  I have reached out to anesthesia to see if we can proceed with surgery tomorrow based on that calcium level. We will let her know what they say.  

## 2022-09-03 ENCOUNTER — Ambulatory Visit (HOSPITAL_COMMUNITY): Payer: 59 | Admitting: Physician Assistant

## 2022-09-03 ENCOUNTER — Encounter (HOSPITAL_COMMUNITY): Payer: Self-pay | Admitting: Psychiatry

## 2022-09-03 ENCOUNTER — Ambulatory Visit (HOSPITAL_COMMUNITY)
Admission: RE | Admit: 2022-09-03 | Discharge: 2022-09-03 | Disposition: A | Discharge status: Home / Self Care | Payer: 59 | Source: Ambulatory Visit | Attending: Psychiatry | Admitting: Psychiatry

## 2022-09-03 ENCOUNTER — Other Ambulatory Visit: Payer: Self-pay

## 2022-09-03 ENCOUNTER — Ambulatory Visit (HOSPITAL_BASED_OUTPATIENT_CLINIC_OR_DEPARTMENT_OTHER): Payer: 59 | Admitting: Physician Assistant

## 2022-09-03 ENCOUNTER — Encounter (HOSPITAL_COMMUNITY)
Admission: RE | Disposition: A | Discharge status: Home / Self Care | Payer: Self-pay | Source: Ambulatory Visit | Attending: Psychiatry

## 2022-09-03 DIAGNOSIS — Z6839 Body mass index (BMI) 39.0-39.9, adult: Secondary | ICD-10-CM

## 2022-09-03 DIAGNOSIS — C541 Malignant neoplasm of endometrium: Secondary | ICD-10-CM | POA: Diagnosis present

## 2022-09-03 DIAGNOSIS — Z794 Long term (current) use of insulin: Secondary | ICD-10-CM | POA: Diagnosis not present

## 2022-09-03 DIAGNOSIS — E119 Type 2 diabetes mellitus without complications: Secondary | ICD-10-CM

## 2022-09-03 DIAGNOSIS — K682 Retroperitoneal fibrosis: Secondary | ICD-10-CM | POA: Diagnosis not present

## 2022-09-03 DIAGNOSIS — Z800 Family history of malignant neoplasm of digestive organs: Secondary | ICD-10-CM | POA: Insufficient documentation

## 2022-09-03 DIAGNOSIS — Z7984 Long term (current) use of oral hypoglycemic drugs: Secondary | ICD-10-CM | POA: Diagnosis not present

## 2022-09-03 DIAGNOSIS — D259 Leiomyoma of uterus, unspecified: Secondary | ICD-10-CM | POA: Diagnosis not present

## 2022-09-03 DIAGNOSIS — D251 Intramural leiomyoma of uterus: Secondary | ICD-10-CM

## 2022-09-03 DIAGNOSIS — E1165 Type 2 diabetes mellitus with hyperglycemia: Secondary | ICD-10-CM | POA: Insufficient documentation

## 2022-09-03 DIAGNOSIS — Z8673 Personal history of transient ischemic attack (TIA), and cerebral infarction without residual deficits: Secondary | ICD-10-CM | POA: Diagnosis not present

## 2022-09-03 DIAGNOSIS — E669 Obesity, unspecified: Secondary | ICD-10-CM | POA: Diagnosis not present

## 2022-09-03 DIAGNOSIS — D250 Submucous leiomyoma of uterus: Secondary | ICD-10-CM

## 2022-09-03 HISTORY — PX: ROBOTIC ASSISTED TOTAL HYSTERECTOMY WITH BILATERAL SALPINGO OOPHERECTOMY: SHX6086

## 2022-09-03 HISTORY — PX: SENTINEL NODE BIOPSY: SHX6608

## 2022-09-03 LAB — POCT PREGNANCY, URINE: Preg Test, Ur: NEGATIVE

## 2022-09-03 LAB — ABO/RH: ABO/RH(D): O POS

## 2022-09-03 LAB — GLUCOSE, CAPILLARY
Glucose-Capillary: 102 mg/dL — ABNORMAL HIGH (ref 70–99)
Glucose-Capillary: 219 mg/dL — ABNORMAL HIGH (ref 70–99)

## 2022-09-03 SURGERY — HYSTERECTOMY, TOTAL, ROBOT-ASSISTED, LAPAROSCOPIC, WITH BILATERAL SALPINGO-OOPHORECTOMY
Anesthesia: General

## 2022-09-03 MED ORDER — DEXAMETHASONE SODIUM PHOSPHATE 10 MG/ML IJ SOLN
INTRAMUSCULAR | Status: AC
  Filled 2022-09-03: qty 1

## 2022-09-03 MED ORDER — ONDANSETRON HCL 4 MG/2ML IJ SOLN
INTRAMUSCULAR | Status: DC | PRN
  Administered 2022-09-03: 4 mg via INTRAVENOUS

## 2022-09-03 MED ORDER — CHLORHEXIDINE GLUCONATE 0.12 % MT SOLN
15.0000 mL | Freq: Once | OROMUCOSAL | Status: AC
  Administered 2022-09-03: 15 mL via OROMUCOSAL

## 2022-09-03 MED ORDER — DEXAMETHASONE SODIUM PHOSPHATE 4 MG/ML IJ SOLN
4.0000 mg | INTRAMUSCULAR | Status: AC
  Administered 2022-09-03: 4 mg via INTRAVENOUS

## 2022-09-03 MED ORDER — INDOCYANINE GREEN 25 MG IV SOLR
INTRAVENOUS | Status: AC
  Filled 2022-09-03: qty 10

## 2022-09-03 MED ORDER — STERILE WATER FOR INJECTION IJ SOLN
INTRAMUSCULAR | Status: AC
  Filled 2022-09-03: qty 10

## 2022-09-03 MED ORDER — OXYCODONE HCL 5 MG PO TABS
5.0000 mg | ORAL_TABLET | Freq: Once | ORAL | Status: AC | PRN
  Administered 2022-09-03: 5 mg via ORAL

## 2022-09-03 MED ORDER — ORAL CARE MOUTH RINSE: 15.0000 mL | Freq: Once | OROMUCOSAL | Status: AC

## 2022-09-03 MED ORDER — KETAMINE HCL 10 MG/ML IJ SOLN
INTRAMUSCULAR | Status: DC | PRN
  Administered 2022-09-03: 10 mg via INTRAVENOUS
  Administered 2022-09-03: 20 mg via INTRAVENOUS
  Administered 2022-09-03: 10 mg via INTRAVENOUS

## 2022-09-03 MED ORDER — LACTATED RINGERS IR SOLN
Status: DC | PRN
  Administered 2022-09-03: 1000 mL

## 2022-09-03 MED ORDER — PHENYLEPHRINE 80 MCG/ML (10ML) SYRINGE FOR IV PUSH (FOR BLOOD PRESSURE SUPPORT)
PREFILLED_SYRINGE | INTRAVENOUS | Status: DC | PRN
  Administered 2022-09-03 (×4): 80 ug via INTRAVENOUS

## 2022-09-03 MED ORDER — ROCURONIUM BROMIDE 10 MG/ML (PF) SYRINGE
PREFILLED_SYRINGE | INTRAVENOUS | Status: DC | PRN
  Administered 2022-09-03: 30 mg via INTRAVENOUS
  Administered 2022-09-03: 10 mg via INTRAVENOUS
  Administered 2022-09-03: 30 mg via INTRAVENOUS
  Administered 2022-09-03 (×2): 20 mg via INTRAVENOUS
  Administered 2022-09-03: 70 mg via INTRAVENOUS
  Administered 2022-09-03: 20 mg via INTRAVENOUS

## 2022-09-03 MED ORDER — SCOPOLAMINE 1 MG/3DAYS TD PT72
1.0000 | MEDICATED_PATCH | TRANSDERMAL | Status: DC
  Administered 2022-09-03: 1.5 mg via TRANSDERMAL
  Filled 2022-09-03: qty 1

## 2022-09-03 MED ORDER — ONDANSETRON HCL 4 MG/2ML IJ SOLN: INTRAMUSCULAR | Status: DC | PRN

## 2022-09-03 MED ORDER — LIDOCAINE 2% (20 MG/ML) 5 ML SYRINGE
INTRAMUSCULAR | Status: DC | PRN
  Administered 2022-09-03: 40 mg via INTRAVENOUS

## 2022-09-03 MED ORDER — PROPOFOL 10 MG/ML IV BOLUS
INTRAVENOUS | Status: DC | PRN
  Administered 2022-09-03: 170 mg via INTRAVENOUS

## 2022-09-03 MED ORDER — STERILE WATER FOR INJECTION IJ SOLN
INTRAMUSCULAR | Status: DC | PRN
  Administered 2022-09-03: 10 mL

## 2022-09-03 MED ORDER — MIDAZOLAM HCL 2 MG/2ML IJ SOLN
INTRAMUSCULAR | Status: DC | PRN
  Administered 2022-09-03: 2 mg via INTRAVENOUS

## 2022-09-03 MED ORDER — ROCURONIUM BROMIDE 10 MG/ML (PF) SYRINGE
PREFILLED_SYRINGE | INTRAVENOUS | Status: AC
  Filled 2022-09-03: qty 10

## 2022-09-03 MED ORDER — BUPIVACAINE LIPOSOME 1.3 % IJ SUSP
INTRAMUSCULAR | Status: DC | PRN
  Administered 2022-09-03: 20 mL

## 2022-09-03 MED ORDER — CEFAZOLIN SODIUM-DEXTROSE 2-4 GM/100ML-% IV SOLN
2.0000 g | INTRAVENOUS | Status: AC
  Administered 2022-09-03: 2 g via INTRAVENOUS
  Filled 2022-09-03: qty 100

## 2022-09-03 MED ORDER — FENTANYL CITRATE (PF) 100 MCG/2ML IJ SOLN
INTRAMUSCULAR | Status: AC
  Filled 2022-09-03: qty 2

## 2022-09-03 MED ORDER — PROPOFOL 10 MG/ML IV BOLUS
INTRAVENOUS | Status: AC
  Filled 2022-09-03: qty 20

## 2022-09-03 MED ORDER — HEMOSTATIC AGENTS (NO CHARGE) OPTIME
TOPICAL | Status: DC | PRN
  Administered 2022-09-03: 1 via TOPICAL

## 2022-09-03 MED ORDER — ONDANSETRON HCL 4 MG/2ML IJ SOLN: 4.0000 mg | Freq: Four times a day (QID) | INTRAMUSCULAR | Status: DC | PRN

## 2022-09-03 MED ORDER — FENTANYL CITRATE PF 50 MCG/ML IJ SOSY: 25.0000 ug | PREFILLED_SYRINGE | INTRAMUSCULAR | Status: DC | PRN

## 2022-09-03 MED ORDER — OXYCODONE HCL 5 MG PO TABS
ORAL_TABLET | ORAL | Status: AC
  Filled 2022-09-03: qty 1

## 2022-09-03 MED ORDER — STERILE WATER FOR IRRIGATION IR SOLN
Status: DC | PRN
  Administered 2022-09-03: 1000 mL

## 2022-09-03 MED ORDER — LACTATED RINGERS IV SOLN: INTRAVENOUS | Status: DC | PRN

## 2022-09-03 MED ORDER — LACTATED RINGERS IV SOLN: INTRAVENOUS | Status: DC

## 2022-09-03 MED ORDER — SODIUM CHLORIDE (PF) 0.9 % IJ SOLN
INTRAMUSCULAR | Status: DC | PRN
  Administered 2022-09-03: 50 mL

## 2022-09-03 MED ORDER — STERILE WATER FOR INJECTION IJ SOLN
INTRAMUSCULAR | Status: DC | PRN
  Administered 2022-09-03: 10 mL via INTRAVENOUS

## 2022-09-03 MED ORDER — FENTANYL CITRATE (PF) 100 MCG/2ML IJ SOLN
INTRAMUSCULAR | Status: DC | PRN
  Administered 2022-09-03 (×2): 50 ug via INTRAVENOUS
  Administered 2022-09-03: 100 ug via INTRAVENOUS
  Administered 2022-09-03 (×2): 50 ug via INTRAVENOUS

## 2022-09-03 MED ORDER — ONDANSETRON HCL 4 MG/2ML IJ SOLN
INTRAMUSCULAR | Status: AC
  Filled 2022-09-03: qty 2

## 2022-09-03 MED ORDER — BUPIVACAINE HCL 0.25 % IJ SOLN
INTRAMUSCULAR | Status: DC | PRN
  Administered 2022-09-03: 50 mL

## 2022-09-03 MED ORDER — HEPARIN SODIUM (PORCINE) 5000 UNIT/ML IJ SOLN
5000.0000 [IU] | INTRAMUSCULAR | Status: AC
  Administered 2022-09-03: 5000 [IU] via SUBCUTANEOUS
  Filled 2022-09-03: qty 1

## 2022-09-03 MED ORDER — ACETAMINOPHEN 500 MG PO TABS
1000.0000 mg | ORAL_TABLET | ORAL | Status: AC
  Administered 2022-09-03: 1000 mg via ORAL
  Filled 2022-09-03: qty 2

## 2022-09-03 MED ORDER — OXYCODONE HCL 5 MG/5ML PO SOLN: 5.0000 mg | Freq: Once | ORAL | Status: AC | PRN

## 2022-09-03 MED ORDER — PHENYLEPHRINE HCL-NACL 20-0.9 MG/250ML-% IV SOLN
INTRAVENOUS | Status: DC | PRN
  Administered 2022-09-03: 40 ug/min via INTRAVENOUS

## 2022-09-03 MED ORDER — KETAMINE HCL 50 MG/5ML IJ SOSY
PREFILLED_SYRINGE | INTRAMUSCULAR | Status: AC
  Filled 2022-09-03: qty 5

## 2022-09-03 MED ORDER — MIDAZOLAM HCL 2 MG/2ML IJ SOLN
INTRAMUSCULAR | Status: AC
  Filled 2022-09-03: qty 2

## 2022-09-03 MED ORDER — LIDOCAINE HCL (PF) 2 % IJ SOLN
INTRAMUSCULAR | Status: AC
  Filled 2022-09-03: qty 5

## 2022-09-03 MED ORDER — BUPIVACAINE HCL 0.25 % IJ SOLN
INTRAMUSCULAR | Status: AC
  Filled 2022-09-03: qty 1

## 2022-09-03 MED ORDER — SUGAMMADEX SODIUM 200 MG/2ML IV SOLN
INTRAVENOUS | Status: DC | PRN
  Administered 2022-09-03: 200 mg via INTRAVENOUS

## 2022-09-03 SURGICAL SUPPLY — 84 items
ADH SKN CLS APL DERMABOND .7 (GAUZE/BANDAGES/DRESSINGS) ×2
AGENT HMST KT MTR STRL THRMB (HEMOSTASIS)
APL ESCP 34 STRL LF DISP (HEMOSTASIS)
APPLICATOR SURGIFLO ENDO (HEMOSTASIS) IMPLANT
BAG LAPAROSCOPIC 12 15 PORT 16 (BASKET) IMPLANT
BAG RETRIEVAL 12/15 (BASKET) ×2
BLADE SURG SZ10 CARB STEEL (BLADE) IMPLANT
COVER BACK TABLE 60X90IN (DRAPES) ×2 IMPLANT
COVER TIP SHEARS 8 DVNC (MISCELLANEOUS) ×2 IMPLANT
DERMABOND ADVANCED .7 DNX12 (GAUZE/BANDAGES/DRESSINGS) ×2 IMPLANT
DRAPE ARM DVNC X/XI (DISPOSABLE) ×8 IMPLANT
DRAPE COLUMN DVNC XI (DISPOSABLE) ×2 IMPLANT
DRAPE SHEET LG 3/4 BI-LAMINATE (DRAPES) ×2 IMPLANT
DRAPE SURG IRRIG POUCH 19X23 (DRAPES) ×2 IMPLANT
DRIVER NDL MEGA SUTCUT DVNCXI (INSTRUMENTS) ×4 IMPLANT
DRIVER NDLE MEGA SUTCUT DVNCXI (INSTRUMENTS) ×2 IMPLANT
DRSG OPSITE POSTOP 4X6 (GAUZE/BANDAGES/DRESSINGS) IMPLANT
DRSG OPSITE POSTOP 4X8 (GAUZE/BANDAGES/DRESSINGS) IMPLANT
ELECT PENCIL ROCKER SW 15FT (MISCELLANEOUS) IMPLANT
ELECT REM PT RETURN 15FT ADLT (MISCELLANEOUS) ×2 IMPLANT
FORCEPS BPLR FENES DVNC XI (FORCEP) ×2 IMPLANT
FORCEPS PROGRASP DVNC XI (FORCEP) ×2 IMPLANT
GAUZE 4X4 16PLY ~~LOC~~+RFID DBL (SPONGE) ×2 IMPLANT
GLOVE BIO SURGEON STRL SZ 6 (GLOVE) ×8 IMPLANT
GLOVE BIO SURGEON STRL SZ 6.5 (GLOVE) ×2 IMPLANT
GLOVE BIOGEL PI IND STRL 6.5 (GLOVE) ×4 IMPLANT
GLOVE SURG SS PI 6.0 STRL IVOR (GLOVE) IMPLANT
GLOVE SURG SS PI 6.5 STRL IVOR (GLOVE) IMPLANT
GOWN STRL REUS W/ TWL LRG LVL3 (GOWN DISPOSABLE) ×8 IMPLANT
GOWN STRL REUS W/TWL LRG LVL3 (GOWN DISPOSABLE) ×8
GRASPER SUT TROCAR 14GX15 (MISCELLANEOUS) IMPLANT
HOLDER FOLEY CATH W/STRAP (MISCELLANEOUS) IMPLANT
IRRIG SUCT STRYKERFLOW 2 WTIP (MISCELLANEOUS) ×2
IRRIGATION SUCT STRKRFLW 2 WTP (MISCELLANEOUS) ×2 IMPLANT
KIT PROCEDURE DVNC SI (MISCELLANEOUS) IMPLANT
KIT TURNOVER KIT A (KITS) IMPLANT
LIGASURE IMPACT 36 18CM CVD LR (INSTRUMENTS) IMPLANT
MANIPULATOR ADVINCU DEL 3.0 PL (MISCELLANEOUS) IMPLANT
MANIPULATOR ADVINCU DEL 3.5 PL (MISCELLANEOUS) IMPLANT
MANIPULATOR UTERINE 4.5 ZUMI (MISCELLANEOUS) IMPLANT
NDL HYPO 21X1.5 SAFETY (NEEDLE) ×2 IMPLANT
NDL INSUFFLATION 14GA 120MM (NEEDLE) IMPLANT
NDL SPNL 18GX3.5 QUINCKE PK (NEEDLE) IMPLANT
NDL SPNL 20GX3.5 QUINCKE YW (NEEDLE) IMPLANT
NEEDLE HYPO 21X1.5 SAFETY (NEEDLE) ×2 IMPLANT
NEEDLE INSUFFLATION 14GA 120MM (NEEDLE) IMPLANT
NEEDLE SPNL 18GX3.5 QUINCKE PK (NEEDLE) IMPLANT
NEEDLE SPNL 20GX3.5 QUINCKE YW (NEEDLE) ×2 IMPLANT
OBTURATOR OPTICAL STND 8 DVNC (TROCAR) ×2
OBTURATOR OPTICALSTD 8 DVNC (TROCAR) ×2 IMPLANT
PACK ROBOT GYN CUSTOM WL (TRAY / TRAY PROCEDURE) ×2 IMPLANT
PAD ARMBOARD 7.5X6 YLW CONV (MISCELLANEOUS) ×2 IMPLANT
PAD POSITIONING PINK XL (MISCELLANEOUS) ×2 IMPLANT
PORT ACCESS TROCAR AIRSEAL 12 (TROCAR) IMPLANT
SCISSORS MNPLR CVD DVNC XI (INSTRUMENTS) ×2 IMPLANT
SCRUB CHG 4% DYNA-HEX 4OZ (MISCELLANEOUS) ×4 IMPLANT
SEAL UNIV 5-12 XI (MISCELLANEOUS) ×6 IMPLANT
SET TRI-LUMEN FLTR TB AIRSEAL (TUBING) ×2 IMPLANT
SPIKE FLUID TRANSFER (MISCELLANEOUS) ×2 IMPLANT
SPONGE T-LAP 18X18 ~~LOC~~+RFID (SPONGE) IMPLANT
SURGIFLO W/THROMBIN 8M KIT (HEMOSTASIS) IMPLANT
SUT MNCRL AB 4-0 PS2 18 (SUTURE) IMPLANT
SUT PDS AB 1 TP1 54 (SUTURE) IMPLANT
SUT VIC AB 0 CT1 27 (SUTURE)
SUT VIC AB 0 CT1 27XBRD ANTBC (SUTURE) IMPLANT
SUT VIC AB 2-0 CT1 27 (SUTURE) ×2
SUT VIC AB 2-0 CT1 TAPERPNT 27 (SUTURE) IMPLANT
SUT VIC AB 4-0 PS2 18 (SUTURE) ×4 IMPLANT
SUT VICRYL 0 27 CT2 27 ABS (SUTURE) IMPLANT
SUT VICRYL 4-0 PS2 18IN ABS (SUTURE) IMPLANT
SUT VLOC 180 0 9IN  GS21 (SUTURE)
SUT VLOC 180 0 9IN GS21 (SUTURE) IMPLANT
SYR 10ML LL (SYRINGE) IMPLANT
SYS BAG RETRIEVAL 10MM (BASKET) ×2
SYS WOUND ALEXIS 18CM MED (MISCELLANEOUS)
SYSTEM BAG RETRIEVAL 10MM (BASKET) IMPLANT
SYSTEM WOUND ALEXIS 18CM MED (MISCELLANEOUS) IMPLANT
TOWEL OR NON WOVEN STRL DISP B (DISPOSABLE) IMPLANT
TRAP SPECIMEN MUCUS 40CC (MISCELLANEOUS) IMPLANT
TRAY FOLEY MTR SLVR 16FR STAT (SET/KITS/TRAYS/PACK) ×2 IMPLANT
TROCAR PORT AIRSEAL 5X120 (TROCAR) IMPLANT
UNDERPAD 30X36 HEAVY ABSORB (UNDERPADS AND DIAPERS) ×4 IMPLANT
WATER STERILE IRR 1000ML POUR (IV SOLUTION) ×2 IMPLANT
YANKAUER SUCT BULB TIP 10FT TU (MISCELLANEOUS) IMPLANT

## 2022-09-03 NOTE — Anesthesia Procedure Notes (Addendum)
Procedure Name: Intubation Date/Time: 09/03/2022 10:24 AM  Performed by: Sindy Guadeloupe, CRNAPre-anesthesia Checklist: Patient identified, Emergency Drugs available, Suction available, Patient being monitored and Timeout performed Patient Re-evaluated:Patient Re-evaluated prior to induction Oxygen Delivery Method: Circle system utilized Preoxygenation: Pre-oxygenation with 100% oxygen Induction Type: IV induction Ventilation: Mask ventilation without difficulty Laryngoscope Size: Mac and 4 Grade View: Grade I Tube type: Oral Tube size: 7.0 mm Number of attempts: 1 Airway Equipment and Method: Stylet Placement Confirmation: ETT inserted through vocal cords under direct vision, positive ETCO2 and breath sounds checked- equal and bilateral Secured at: 22 cm Tube secured with: Tape Dental Injury: Teeth and Oropharynx as per pre-operative assessment  Comments: Intubated by Willette Pa Paramedic student supervised by Dr Desmond Lope. Patient verbally consented to have paramedic student perform intubation.

## 2022-09-03 NOTE — Discharge Instructions (Addendum)
AFTER SURGERY INSTRUCTIONS   Return to work: 4-6 weeks if applicable   Activity: 1. Be up and out of the bed during the day.  Take a nap if needed.  You may walk up steps but be careful and use the hand rail.  Stair climbing will tire you more than you think, you may need to stop part way and rest.    2. No lifting or straining for 6 weeks over 10 pounds. No pushing, pulling, straining for 6 weeks.   3. No driving for around 1 week(s).  Do not drive if you are taking narcotic pain medicine and make sure that your reaction time has returned.    4. You can shower as soon as the next day after surgery. Shower daily.  Use your regular soap and water (not directly on the incision) and pat your incision(s) dry afterwards; don't rub.  No tub baths or submerging your body in water until cleared by your surgeon. If you have the soap that was given to you by pre-surgical testing that was used before surgery, you do not need to use it afterwards because this can irritate your incisions.    5. No sexual activity and nothing in the vagina for 10-12 weeks.   6. You may experience a small amount of clear drainage from your incisions, which is normal.  If the drainage persists, increases, or changes color please call the office.   7. Do not use creams, lotions, or ointments such as neosporin on your incisions after surgery until advised by your surgeon because they can cause removal of the dermabond glue on your incisions.     8. You may experience vaginal spotting after surgery or around the 6-8 week mark from surgery when the stitches at the top of the vagina begin to dissolve.  The spotting is normal but if you experience heavy bleeding, call our office.   9. Take Tylenol first for pain if you are able to take these medication and only use narcotic pain medication for severe pain not relieved by the Tylenol.  Monitor your Tylenol intake to a max of 4,000 mg in a 24 hour period.    Diet: 1. Low sodium  Heart Healthy Diet is recommended but you are cleared to resume your normal (before surgery) diet after your procedure.   2. It is safe to use a laxative, such as Miralax or Colace, if you have difficulty moving your bowels. You have been prescribed Sennakot-S to take at bedtime every evening after surgery to keep bowel movements regular and to prevent constipation.     Wound Care: 1. Keep clean and dry.  Shower daily.   Reasons to call the Doctor: Fever - Oral temperature greater than 100.4 degrees Fahrenheit Foul-smelling vaginal discharge Difficulty urinating Nausea and vomiting Increased pain at the site of the incision that is unrelieved with pain medicine. Difficulty breathing with or without chest pain New calf pain especially if only on one side Sudden, continuing increased vaginal bleeding with or without clots.   Contacts: For questions or concerns you should contact:   Dr. Clide Cliff at [phone]   Kelly Mccreedy, NP at [phone]   After Hours: call [phone] and have the GYN Oncologist paged/contacted (after 5 pm or on the weekends). You will speak with an after hours RN and let he or she know you have had surgery.   Messages sent via mychart are for non-urgent matters and are not responded to after hours so for  urgent needs, please call the after hours number.

## 2022-09-03 NOTE — Op Note (Signed)
GYNECOLOGIC ONCOLOGY OPERATIVE NOTE  Date of Service: 09/03/2022  Preoperative Diagnosis: FIGO grade 1 endometrial cancer, obesity (BMI 39), fibroid uterus, T2DM  Postoperative Diagnosis: Same  Procedures: Robotic-assisted total laparoscopic hysterectomy (>250 grams), bilateral salpingo-oophorectomy, bilateral sentinel lymph node evaluation and biopsy, bilateral ureterolysis, laparotomy via pfannenstiel for specimen retrieval (Modifer 22: morbid obesity, BMI 39, with significant retroperitoneal and intraperitoneal adiposity requiring additional OR personnel for positioning and retraction. Obesity made retroperitoneal visualization limited, increasing the complexity of the case and necessitating additional instrumentation for retraction and to create safe exposure. Obesity related complexity increased the duration of the procedure by 45 minutes.)   Surgeon: Clide Cliff, MD  Assistants: Antionette Char, MD and (an MD assistant was necessary for tissue manipulation, management of robotic instrumentation, retraction and positioning due to the complexity of the case and hospital policies)  Anesthesia: General  Estimated Blood Loss: 100 mL    Fluids: 2000 ml, crystalloid  Urine Output: 600 ml, clear yellow  Findings: Normal upper abdominal survey with normal liver surface and diaphragm. Normal appearing small and large bowel. Enlarged fibroid uterus to approximately 17cm. Normal bilateral tubes and ovaries. Adhesions of the sigmoid colon to the left pelvic sidewall. Sentinel mapping on the left to the external iliac; sentinel mapping on right to the common iliac via a subureteral channel.  Specimens:  ID Type Source Tests Collected by Time Destination  1 : Uterus, Cervix, Bilateral Fallopian Tubes and Ovaries Tissue PATH Gyn tumor resection SURGICAL PATHOLOGY Clide Cliff, MD 09/03/2022 1406   2 : Left External Iliac Sentinel Lymph Node Tissue PATH Sentinel Lymph Node SURGICAL PATHOLOGY  Clide Cliff, MD 09/03/2022 1409   3 : Right Common Iliac Sentinel Lymph Node Tissue PATH Sentinel Lymph Node SURGICAL PATHOLOGY Clide Cliff, MD 09/03/2022 1427     Complications:  None  Indications for Procedure: Kelly Davies is a 52 y.o. woman with FIGO grade 1 endometrial cancer.  Also with poorly controlled diabetes previously requiring a dilation curettage and IUD placement for treatment.  Now with improved diabetes control and ready to proceed with surgery..  Prior to the procedure, all risks, benefits, and alternatives were discussed and informed surgical consent was signed.  Procedure: Patient was taken to the operating room where general anesthesia was achieved.  She was positioned in dorsal lithotomy and prepped and draped.  A foley catheter was inserted into the bladder. 1 ml of dilute Indo-Cyanine dye was was injected at 1cm and 1mm deep at 3 and 9 o'clock in the cervical stroma.  The cervix was dilated and an Advincula uterine manipulator with a colpotomy ring was inserted into the uterus.  A 12 mm incision was made in the left upper quadrant near Palmer's point.  The abdomen was entered with a 5 mm OptiView trocar under direct visualization.  The abdomen was insufflated, the patient placed in steep Trendelenburg, and additional trocars were placed as follows: an 8mm trocar superior to the umbilicus, two 8 mm robotic trocars in the right abdomen, and one 8 mm robotic trocar in the left abdomen.  The left upper quadrant trocar was removed and replaced with a 12 mm airseal trocar.  All trocars were placed under direct visualization.  The bowels were moved into the upper abdomen.  The DaVinci robotic surgical system was brought to the patient's bedside and docked.  The right round ligament was transected and the retroperitoneum entered.  The right ureter was identified transperitoneally. Due to limited visualization due to the enlarged fibroid uterus, the  sentinel lymph node  dissection was not performed at this time.  The right infundibulopelvic ligament was isolated, cauterized, and transected. The posterior peritoneum was opened to the KOH ring.  The anterior peritoneum was opened and the bladder flap was initiated.  Ureterolysis was performed on the right to lateralize the ureter.  This was performed from the level of the pelvic brim to insertion to the bladder.  The right uterine artery was skeletonized, cauterized, and transected at the level of the KOH ring with the ureter noted to be far from the level of dissection. Additional cautery was used in a C-shaped fashion to allow the remainder of the broad, cardinal, and uterosacral ligaments with the uterine vessels to be transected and fall away from the KOH ring.  At this time, attention was turned to the left.  The left round ligament was transected and the retroperitoneum entered.  The left ureter was identified retroperitoneally.  Adhesions of the sigmoid colon to the left pelvic sidewall were lysed with cold scissors.  The left infundibulopelvic ligament was isolated, cauterized, and transected.  The posterior peritoneum was opened to the KOH ring.  The anterior peritoneum was opened and the bladder flap completed.  Additional extensive dissection was done on the left pelvic sidewall given adherent structures from the fibroid extending more into this sidewall.  Ureterolysis was also performed on the left from the pelvic brim to insertion of the bladder.  The left uterine artery was identified at its origin.  It was isolated and cauterized here.  Additional peritoneal attachments to the lower uterine segment and uterus were transected.  The left uterine artery at the level of the [facility] ring was then skeletonized, cauterized, and transected.  Additional cautery was used in a C-shaped fashion to allow the remainder of the broad, cardinal, and uterosacral ligaments with the uterine vessels to be transected and fall away from the  KOH ring. A colpotomy was made circumferentially following the contours of the KOH ring.  The uterine specimen was too large to be removed from the vagina or fit into an Endo Catch bag.  This was left in the upper abdomen for later retrieval  The retroperitoneal spaces were reevaluated. The paravesical and pararectal spaces were opened on the left, and the node was found to be located external iliac artery. A sentinel lymph node dissection was performed taking care to avoid injury to the ureter, superior vesicle artery or obturator nerve. A similar procedure was performed on right with the sentinel lymph node identified via a sub-ureteral channel at the right common iliac. The sentinel lymph nodes mentioned above were identified and removed through the assistant trocar.  The vaginal cuff was closed with a running stitch of 0 Vicryl suture.  The pelvis was irrigated.  Surgiflo was placed in the lymph node dissection sites and at the cuff. All operative sites were found to be hemostatic.  All instruments were removed and the robot was taken from the patient's bedside. The fascia at the 12 mm incision was closed with 0 Vicryl using a PMI device.   Next, for specimen retrieval a pfannenstiel incision was made 2 cm above the pubic symphysis. The skin was incised with a scalpel and the incision was taken down to the level of the fascia with monopolar energy. The fascia was incised and the incision was carried laterally with monopolar energy on cut current. The fascia was tented up and dissected off the underlying rectus muscles. The rectus muscles were separated in the  midline and the peritoneum was tented up and entered sharply. The abdomen was desufflated and all ports were removed. The peritoneal incision was extended bluntly.  The uterus was grasped and brought up through the incision.  Lahey clamps were used on the fibroid to provide traction to remove the specimen in its entirety.  The fascia was closed with  a running stitch of #1 PDS. Exparel was injected at the incision site in standard fashion. The subcutaneous tissues were irrigated and hemostasis achieved. The subcutaneous space was approximated with 2-0 Vicryl in a running fashion. The skin was closed with 4-0 monocryl in a subcuticular fashion followed by surgical glue.  The skin at all laparoscopic incisions was closed with 4-0 Vicryl to reapproximate the subcutaneous tissue and 4-0 monocryl in a subcuticular fashion followed by surgical glue.  Patient tolerated the procedure well. Sponge, lap, and instrument counts were correct.  Patient received 2 gm of Ancef prior to skin incision for routine perioperative antibiotic prophylaxis.  She was extubated and taken to the PACU in stable condition.  Clide Cliff, MD Gynecologic Oncology

## 2022-09-03 NOTE — Interval H&P Note (Signed)
History and Physical Interval Note:  09/03/2022 9:38 AM  Kelly Davies  has presented today for surgery, with the diagnosis of ENDOMETRIAL CANCER.  The various methods of treatment have been discussed with the patient and family. After consideration of risks, benefits and other options for treatment, the patient has consented to  Procedure(s): XI ROBOTIC ASSISTED TOTAL HYSTERECTOMY WITH BILATERAL SALPINGO OOPHORECTOMY (Bilateral) SENTINEL NODE BIOPSY (N/A) POSSIBLE LYMPH NODE DISSECTION (N/A) as a surgical intervention.  The patient's history has been reviewed, patient examined, no change in status, stable for surgery.  I have reviewed the patient's chart and labs.  Questions were answered to the patient's satisfaction.     Calle Schader

## 2022-09-03 NOTE — Anesthesia Preprocedure Evaluation (Signed)
Anesthesia Evaluation  Patient identified by MRN, date of birth, ID band Patient awake    Reviewed: Allergy & Precautions, H&P , NPO status , Patient's Chart, lab work & pertinent test results  Airway Mallampati: II   Neck ROM: full    Dental   Pulmonary neg pulmonary ROS   breath sounds clear to auscultation       Cardiovascular hypertension,  Rhythm:regular Rate:Normal     Neuro/Psych Seizures -,  TIACVA    GI/Hepatic   Endo/Other  diabetes, Type 2    Renal/GU      Musculoskeletal   Abdominal   Peds  Hematology   Anesthesia Other Findings   Reproductive/Obstetrics Endometrial CA                             Anesthesia Physical Anesthesia Plan  ASA: 2  Anesthesia Plan: General   Post-op Pain Management:    Induction: Intravenous  PONV Risk Score and Plan: 3 and Ondansetron, Dexamethasone, Midazolam and Treatment may vary due to age or medical condition  Airway Management Planned: Oral ETT  Additional Equipment:   Intra-op Plan:   Post-operative Plan: Extubation in OR  Informed Consent: I have reviewed the patients History and Physical, chart, labs and discussed the procedure including the risks, benefits and alternatives for the proposed anesthesia with the patient or authorized representative who has indicated his/her understanding and acceptance.     Dental advisory given  Plan Discussed with: CRNA, Anesthesiologist and Surgeon  Anesthesia Plan Comments:        Anesthesia Quick Evaluation

## 2022-09-03 NOTE — Transfer of Care (Signed)
Immediate Anesthesia Transfer of Care Note  Patient: Kelly Davies  Procedure(s) Performed: XI ROBOTIC ASSISTED TOTAL HYSTERECTOMY WITH BILATERAL SALPINGO OOPHORECTOMY > 250 grams, BILATERAL URETEROLYSIS, LAPAROTOMY (Bilateral) SENTINEL NODE BIOPSY  Patient Location: PACU  Anesthesia Type:General  Level of Consciousness: awake, alert , and oriented  Airway & Oxygen Therapy: Patient Spontanous Breathing and Patient connected to face mask oxygen  Post-op Assessment: Report given to RN, Post -op Vital signs reviewed and stable, and Patient moving all extremities X 4  Post vital signs: Reviewed and stable  Last Vitals:  Vitals Value Taken Time  BP 155/76   Temp    Pulse 101 09/03/22 1555  Resp 24 09/03/22 1555  SpO2 99 % 09/03/22 1555  Vitals shown include unvalidated device data.  Last Pain:  Vitals:   09/03/22 0832  TempSrc:   PainSc: 0-No pain         Complications: No notable events documented.

## 2022-09-03 NOTE — Anesthesia Postprocedure Evaluation (Signed)
Anesthesia Post Note  Patient: Kelly Davies  Procedure(s) Performed: XI ROBOTIC ASSISTED TOTAL HYSTERECTOMY WITH BILATERAL SALPINGO OOPHORECTOMY > 250 grams, BILATERAL URETEROLYSIS, LAPAROTOMY (Bilateral) SENTINEL NODE BIOPSY     Patient location during evaluation: PACU Anesthesia Type: General Level of consciousness: awake and alert Pain management: pain level controlled Vital Signs Assessment: post-procedure vital signs reviewed and stable Respiratory status: spontaneous breathing, nonlabored ventilation, respiratory function stable and patient connected to nasal cannula oxygen Cardiovascular status: blood pressure returned to baseline and stable Postop Assessment: no apparent nausea or vomiting Anesthetic complications: no   No notable events documented.  Last Vitals:  Vitals:   09/03/22 1600 09/03/22 1615  BP: (!) 136/52 (!) 153/76  Pulse: 99 99  Resp: 16 19  Temp: 37 C   SpO2: 99% 94%    Last Pain:  Vitals:   09/03/22 1638  TempSrc:   PainSc: 4                  Harjas Biggins S

## 2022-09-04 ENCOUNTER — Telehealth: Payer: Self-pay | Admitting: *Deleted

## 2022-09-04 ENCOUNTER — Encounter (HOSPITAL_COMMUNITY): Payer: Self-pay | Admitting: Psychiatry

## 2022-09-04 NOTE — Telephone Encounter (Signed)
Fax FMLA paperwork  

## 2022-09-04 NOTE — Telephone Encounter (Signed)
Spoke with Kelly Davies this morning. She states she is eating, drinking and urinating well. She has not had a BM yet but is passing gas. She is taking senokot as prescribed and encouraged her to drink plenty of water. She denies fever or chills. Incisions are dry and intact. She rates her pain 3-4/10. Her pain is controlled with tylenol and Ultram.    Instructed to call office with any fever, chills, purulent drainage, uncontrolled pain or any other questions or concerns. Patient verbalizes understanding.   Pt aware of post op appointments as well as the office number [phone] and after hours number [phone] to call if she has any questions or concerns

## 2022-09-05 LAB — SURGICAL PATHOLOGY

## 2022-09-06 LAB — SURGICAL PATHOLOGY

## 2022-09-11 ENCOUNTER — Inpatient Hospital Stay (HOSPITAL_BASED_OUTPATIENT_CLINIC_OR_DEPARTMENT_OTHER): Payer: 59 | Admitting: Psychiatry

## 2022-09-11 DIAGNOSIS — C541 Malignant neoplasm of endometrium: Secondary | ICD-10-CM

## 2022-09-11 NOTE — Progress Notes (Signed)
Gynecologic Oncology Telehealth Note  I connected with Kelly Davies on 09/11/22 at 12:15 PM EDT by telephone and verified that I am speaking with the correct person using two identifiers.  I discussed the limitations, risks, security and privacy concerns of performing an evaluation and management service by telemedicine and the availability of in-person appointments. I also discussed with the patient that there may be a patient responsible charge related to this service. The patient expressed understanding and agreed to proceed.  Other persons participating in the visit and their role in the encounter: none.  Patient's location: Home ([place]) Provider's location: [facility]  Date of Service: 09/11/2022 Referring Provider: Shea Evans, MD   Assessment & Plan: Kelly Davies is a 52 y.o. woman with Stage IA2 FIGO grade 1 endometrioid endometrial cancer (no LVSI, p53wt, MMRp) who presents for postop.  Postop: - Pt recovering well from surgery and healing appropriately postoperatively - Intraoperative findings and pathology results reviewed. - Ongoing postoperative expectations and precautions reviewed. Continue with no lifting >10lbs through 6 weeks postoperatively. Nothing in the vagina for 8-12 weeks. - Okay to remove incision bandage.   Endometrial cancer: - Will reiterate at postop visit, but based on pathology results, okay to proceed with close surveillance.   Hypercalcemia: - Ongoing for several months. - Continue close follow-up with endocrinology.    RTC for postop visit on 09/30/22.  Clide Cliff, MD Gynecologic Oncology   Medical Decision Making I personally spent  TOTAL 15 minutes face-to-face and non-face-to-face in the care of this patient, which includes all pre, intra, and post visit time on the date of service.  2 minutes spent reviewing records prior to the visit 10 Minutes in patient contact via phone 3 minutes charting , conferring with consultants  etc.   ----------------------- Reason for Visit: Postop  Treatment History: Oncology History  Endometrial cancer (HCC)  04/14/2022 Imaging   Pelvic ultrasound: IMPRESSION: Heterogeneous uterine fibroid with subsequently limited evaluation of the endometrium.   05/28/2022 Initial Biopsy   Endometrial biopsy: FIGO grade 1 endometrioid adenocarcinoma    06/25/2022 Initial Diagnosis   Endometrial cancer (HCC)   06/25/2022 Surgery   Dilation and curettage, Mirena intrauterine device insertion, vaginal biopsy     Pathology Results   A. ENDOMETRIUM, CURETTAGE:  - Endometrioid carcinoma, FIGO grade 1.  See comment   B. VAGINA, POSTERIOR, BIOPSY:  - Vaginal mucosa with no specific histopathologic changes  - Negative for dysplasia      Interval History: Pt reports that she is overall doing well. Notes she is only needing tylenol PRN for pain. Eating/drinking well. Having regular BM. Voiding without issue. No bleeding. Ambulating without issue.   Past Medical/Surgical History: Past Medical History:  Diagnosis Date   Anemia    Cancer (HCC)    Diabetes mellitus without complication (HCC)    Hypercholesteremia    Hypertension    Partial seizure (HCC)    Stroke (HCC)    TIA (transient ischemic attack)     Past Surgical History:  Procedure Laterality Date   COLONOSCOPY     DILATION AND CURETTAGE OF UTERUS N/A 06/25/2022   Procedure: DILATATION AND CURETTAGE, VAGINAL BIOPSY;  Surgeon: Clide Cliff, MD;  Location: [facility];  Service: Gynecology;  Laterality: N/A;   INTRAUTERINE DEVICE (IUD) INSERTION N/A 06/25/2022   Procedure: INTRAUTERINE DEVICE (IUD) INSERTION;  Surgeon: Clide Cliff, MD;  Location: [facility];  Service: Gynecology;  Laterality: N/A;   NO PAST SURGERIES     ROBOTIC ASSISTED TOTAL HYSTERECTOMY  WITH BILATERAL SALPINGO OOPHERECTOMY Bilateral 09/03/2022   Procedure: XI ROBOTIC ASSISTED TOTAL HYSTERECTOMY WITH BILATERAL SALPINGO OOPHORECTOMY > 250 grams, BILATERAL  URETEROLYSIS, LAPAROTOMY;  Surgeon: Clide Cliff, MD;  Location: [facility];  Service: Gynecology;  Laterality: Bilateral;   SENTINEL NODE BIOPSY N/A 09/03/2022   Procedure: SENTINEL NODE BIOPSY;  Surgeon: Clide Cliff, MD;  Location: [facility];  Service: Gynecology;  Laterality: N/A;   TEE WITHOUT CARDIOVERSION N/A 03/20/2017   Procedure: TRANSESOPHAGEAL ECHOCARDIOGRAM (TEE);  Surgeon: Yates Decamp, MD;  Location: [facility] END[facility];  Service: Cardiovascular;  Laterality: N/A;    Family History  Problem Relation Age of Onset   Colon cancer Maternal Uncle        60s   Endometrial cancer Neg Hx    Breast cancer Neg Hx    Ovarian cancer Neg Hx     Social History   Socioeconomic History   Marital status: Single    Spouse name: Not on file   Number of children: Not on file   Years of education: Not on file   Highest education level: Not on file  Occupational History   Not on file  Tobacco Use   Smoking status: Never   Smokeless tobacco: Never  Vaping Use   Vaping Use: Never used  Substance and Sexual Activity   Alcohol use: No   Drug use: No   Sexual activity: Not Currently  Other Topics Concern   Not on file  Social History Narrative   Not on file   Social Determinants of Health   Financial Resource Strain: Not on file  Food Insecurity: No Food Insecurity (06/13/2022)   Hunger Vital Sign    Worried About Running Out of Food in the Last Year: Never true    Ran Out of Food in the Last Year: Never true  Transportation Needs: No Transportation Needs (06/13/2022)   PRAPARE - Administrator, Civil Service (Medical): No    Lack of Transportation (Non-Medical): No  Physical Activity: Not on file  Stress: Not on file  Social Connections: Not on file    Current Medications:  Current Outpatient Medications:    acetaminophen (TYLENOL) 500 MG tablet, Take 500 mg by mouth every 6 (six) hours as needed for moderate pain, fever or headache., Disp: , Rfl:    aspirin 325 MG  tablet, Take 1 tablet (325 mg total) by mouth daily., Disp: 30 tablet, Rfl: 0   cetirizine (ZYRTEC) 10 MG tablet, Take 10 mg by mouth daily., Disp: , Rfl:    Cholecalciferol (VITAMIN D-3) 125 MCG (5000 UT) TABS, Take 5,000 Units by mouth daily., Disp: , Rfl:    folic acid (FOLVITE) 1 MG tablet, Take 1 mg by mouth daily., Disp: , Rfl:    glimepiride (AMARYL) 4 MG tablet, Take 4 mg by mouth daily with breakfast., Disp: , Rfl:    ibuprofen (ADVIL) 200 MG tablet, Take 600 mg by mouth as needed for moderate pain., Disp: , Rfl:    Iron-FA-B Cmp-C-Biot-Probiotic (FUSION PLUS PO), Take 1 capsule by mouth daily., Disp: , Rfl:    JANUVIA 100 MG tablet, Take 100 mg by mouth daily., Disp: , Rfl:    levETIRAcetam (KEPPRA) 500 MG tablet, Take 1 tablet (500 mg total) by mouth 2 (two) times daily. Must be seen for further refills. Call [phone] to make an appointment. (Patient taking differently: Take 500 mg by mouth daily.), Disp: 60 tablet, Rfl: 0   megestrol (MEGACE) 40 MG tablet, Take 2 tablets (80 mg  total) by mouth 2 (two) times daily., Disp: 120 tablet, Rfl: 0   montelukast (SINGULAIR) 10 MG tablet, Take 10 mg by mouth at bedtime., Disp: , Rfl:    Polyethyl Glycol-Propyl Glycol (SYSTANE OP), Place 1 drop into both eyes as needed (dry eye)., Disp: , Rfl:    rosuvastatin (CRESTOR) 20 MG tablet, Take 20 mg by mouth daily., Disp: , Rfl:    senna-docusate (SENOKOT-S) 8.6-50 MG tablet, Take 2 tablets by mouth at bedtime. For AFTER surgery, do not take if having diarrhea, Disp: 30 tablet, Rfl: 0   TOUJEO SOLOSTAR 300 UNIT/ML Solostar Pen, Inject 20 Units into the skin as needed (high BS)., Disp: , Rfl:    traMADol (ULTRAM) 50 MG tablet, Take 1 tablet (50 mg total) by mouth every 12 (twelve) hours as needed for severe pain. For AFTER surgery only, do not take and drive, Disp: 10 tablet, Rfl: 0   valsartan-hydrochlorothiazide (DIOVAN-HCT) 80-12.5 MG tablet, Take 1 tablet by mouth daily., Disp: , Rfl:   Review of  Symptoms: Pertinent positives as per HPI.  Physical Exam: Deferred given limitations of phone visit.  Laboratory & Radiologic Studies: FINAL MICROSCOPIC DIAGNOSIS:  A. UTERUS, CERVIX, BILATERAL FALLOPIAN TUBES AND OVARIES: Endometrium     Endometrioid adenocarcinoma, FIGO grade 1 with squamous differentiation (pT1a)     Carcinoma invades superficial myometrium 2 mm into a 40 mm myometrium (5%)     Negative for lymphovascular involvement     All margins negative for carcinoma     See oncology table Cervix     Unremarkable, negative for malignancy Myometrium     Leiomyomata, negative for malignancy Bilateral ovaries     Unremarkable, negative for malignancy Bilateral fallopian tubes     Unremarkable, negative for malignancy  B. SENTINEL LYMPH NODE, LEFT EXTERNAL ILIAC, EXCISION: One lymph node negative for metastatic carcinoma (0/1)  C. SENTINEL LYMPH NODE, RIGHT COMMON ILIAC, EXCISION: One lymph node negative for metastatic carcinoma (0/1)  ONCOLOGY TABLE: UTERUS, CARCINOMA OR CARCINOSARCOMA: Resection Procedure: Total hysterectomy, bilateral salpingo-oophorectomy and left and right sentinel lymph nodes Histologic Type: Endometrioid with squamous differentiation Histologic Grade: FIGO grade 1 Myometrial Invasion:      Depth of Myometrial Invasion (mm): 2 mm      Myometrial Thickness (mm): 40 mm      Percentage of Myometrial Invasion: 5 Uterine Serosa Involvement: Not identified Cervical stromal Involvement: Not identified Extent of involvement of other tissue/organs: Not identified Peritoneal/Ascitic Fluid: Not applicable Lymphovascular Invasion: Not identified Regional Lymph Nodes:      Pelvic Lymph Nodes Examined:          2 Sentinel          0 non-sentinel          2 total      Pelvic Lymph Nodes with Metastasis: 0          Macrometastasis: (>2.0 mm): 0          Micrometastasis: (>0.2 mm and < 2.0 mm): 0          Isolated Tumor Cells (<0.2 mm): 0           Laterality of Lymph Node with Tumor: Not applicable          Extracapsular Extension: Not applicable Distant Metastasis:      Distant Site(s) Involved: Not applicable Pathologic Stage Classification (pTNM, AJCC 8th Edition): pT1a, pN0 Ancillary Studies: MMR, MSI and p53 are pending Representative Tumor Block: A4, A8, A9 Comment(s): Cytokeratin AE1/AE3 performed on the  sentinel lymph nodes (parts B and C) is negative. (v4.2.0.1)   [facility] EXPRESSION RESULTS   TEST           RESULT  MLH1:          Preserved nuclear expression  MSH2:          Preserved nuclear expression  MSH6:          Preserved nuclear expression  PMS2:          Preserved nuclear expression

## 2022-09-18 ENCOUNTER — Encounter: Payer: Self-pay | Admitting: Psychiatry

## 2022-09-30 ENCOUNTER — Other Ambulatory Visit (HOSPITAL_COMMUNITY)
Admit: 2022-09-30 | Discharge: 2022-09-30 | Disposition: A | Discharge status: Home / Self Care | Payer: 59 | Source: Ambulatory Visit | Attending: Psychiatry | Admitting: Psychiatry

## 2022-09-30 ENCOUNTER — Ambulatory Visit: Payer: 59

## 2022-09-30 ENCOUNTER — Other Ambulatory Visit: Payer: Self-pay

## 2022-09-30 ENCOUNTER — Ambulatory Visit: Payer: 59 | Admitting: Psychiatry

## 2022-09-30 ENCOUNTER — Inpatient Hospital Stay: Payer: 59 | Attending: Psychiatry | Admitting: Psychiatry

## 2022-09-30 VITALS — BP 133/79 | HR 106 | Temp 100.0°F | Resp 16 | Ht 62.99 in | Wt 215.0 lb

## 2022-09-30 DIAGNOSIS — R509 Fever, unspecified: Secondary | ICD-10-CM | POA: Insufficient documentation

## 2022-09-30 DIAGNOSIS — Z90710 Acquired absence of both cervix and uterus: Secondary | ICD-10-CM | POA: Insufficient documentation

## 2022-09-30 DIAGNOSIS — Z90722 Acquired absence of ovaries, bilateral: Secondary | ICD-10-CM | POA: Diagnosis not present

## 2022-09-30 DIAGNOSIS — Z7189 Other specified counseling: Secondary | ICD-10-CM

## 2022-09-30 DIAGNOSIS — C541 Malignant neoplasm of endometrium: Secondary | ICD-10-CM

## 2022-09-30 LAB — CBC WITH DIFFERENTIAL/PLATELET
Abs Immature Granulocytes: 0.02 10*3/uL (ref 0.00–0.07)
Basophils Absolute: 0.1 10*3/uL (ref 0.0–0.1)
Basophils Relative: 1 %
Eosinophils Absolute: 0.4 10*3/uL (ref 0.0–0.5)
Eosinophils Relative: 4 %
HCT: 38.3 % (ref 36.0–46.0)
Hemoglobin: 12.3 g/dL (ref 12.0–15.0)
Immature Granulocytes: 0 %
Lymphocytes Relative: 26 %
Lymphs Abs: 2.1 10*3/uL (ref 0.7–4.0)
MCH: 28.1 pg (ref 26.0–34.0)
MCHC: 32.1 g/dL (ref 30.0–36.0)
MCV: 87.6 fL (ref 80.0–100.0)
Monocytes Absolute: 0.9 10*3/uL (ref 0.1–1.0)
Monocytes Relative: 11 %
Neutro Abs: 4.7 10*3/uL (ref 1.7–7.7)
Neutrophils Relative %: 58 %
Platelets: 480 10*3/uL — ABNORMAL HIGH (ref 150–400)
RBC: 4.37 MIL/uL (ref 3.87–5.11)
RDW: 13.2 % (ref 11.5–15.5)
WBC: 8.2 10*3/uL (ref 4.0–10.5)
nRBC: 0 % (ref 0.0–0.2)

## 2022-09-30 LAB — COMPREHENSIVE METABOLIC PANEL
ALT: 46 U/L — ABNORMAL HIGH (ref 0–44)
AST: 26 U/L (ref 15–41)
Albumin: 4.5 g/dL (ref 3.5–5.0)
Alkaline Phosphatase: 70 U/L (ref 38–126)
Anion gap: 10 (ref 5–15)
BUN: 29 mg/dL — ABNORMAL HIGH (ref 6–20)
CO2: 21 mmol/L — ABNORMAL LOW (ref 22–32)
Calcium: 12.2 mg/dL — ABNORMAL HIGH (ref 8.9–10.3)
Chloride: 105 mmol/L (ref 98–111)
Creatinine, Ser: 1.4 mg/dL — ABNORMAL HIGH (ref 0.44–1.00)
GFR, Estimated: 45 mL/min — ABNORMAL LOW (ref >60)
Glucose, Bld: 167 mg/dL — ABNORMAL HIGH (ref 70–99)
Potassium: 4.2 mmol/L (ref 3.5–5.1)
Sodium: 136 mmol/L (ref 135–145)
Total Bilirubin: 0.5 mg/dL (ref 0.3–1.2)
Total Protein: 8.6 g/dL — ABNORMAL HIGH (ref 6.5–8.1)

## 2022-09-30 NOTE — Progress Notes (Signed)
Gynecologic Oncology Return Clinic Visit  Date of Service: 09/30/2022 Referring Provider: Shea Evans, MD   Assessment & Plan: Kelly Davies is a 52 y.o. woman with Stage  IA2 FIGO grade 1 endometrioid endometrial cancer (no LVSI, p53wt, MMRp) s/p RA-TLH, BSO, SLNB, ureterolysis, laparotomy for specimen retrieval on 09/03/22, who presents for postop.   Postop: - Pt recovering well from surgery and healing appropriately postoperatively - Intraoperative findings and pathology results reviewed. - Ongoing postoperative expectations and precautions reviewed. Continue with no lifting >10lbs through 6 weeks postoperatively  Elevated temperature: - No signs/symptoms of infection. Reassuring exam. - Plan labs today. WBC wnl. - Infection precautions reviewed.  Endometrial cancer: - Pathology results reviewed in detail - No high-intermediate risk factors. - Recommend observation. - Signs/symptoms of recurrence reviewed. - Surveillance reviewed. Follow-up q6 months x2 years then yearly.   Hypercalcemia: - Ongoing for several months. - Continue close follow-up with endocrinology. Reports she has follow-up next month.   RTC 28mo.  Clide Cliff, MD Gynecologic Oncology   Medical Decision Making I personally spent  TOTAL 20 minutes face-to-face and non-face-to-face in the care of this patient, which includes all pre, intra, and post visit time on the date of service.    ----------------------- Reason for Visit: Postop/Treatment counseling  Treatment History: Oncology History  Endometrial cancer (HCC)  04/14/2022 Imaging   Pelvic ultrasound: IMPRESSION: Heterogeneous uterine fibroid with subsequently limited evaluation of the endometrium.   05/28/2022 Initial Biopsy   Endometrial biopsy: FIGO grade 1 endometrioid adenocarcinoma    06/25/2022 Initial Diagnosis   Endometrial cancer (HCC)   06/25/2022 Surgery   Dilation and curettage, Mirena intrauterine device insertion,  vaginal biopsy     Pathology Results   A. ENDOMETRIUM, CURETTAGE:  - Endometrioid carcinoma, FIGO grade 1.  See comment   B. VAGINA, POSTERIOR, BIOPSY:  - Vaginal mucosa with no specific histopathologic changes  - Negative for dysplasia      Interval History: Pt reports that she is recovering well from surgery. She is using tylenol PRN for pain. She is eating and drinking well. She is voiding without issue and having regular bowel movements.   Denies fevers, chills, diarrhea, sick contacts, sob, cough, drainage, redness. Also denies dysuria, urinary frequency, urgency.  Past Medical/Surgical History: Past Medical History:  Diagnosis Date   Anemia    Cancer (HCC)    Diabetes mellitus without complication (HCC)    Hypercholesteremia    Hypertension    Partial seizure (HCC)    Stroke (HCC)    TIA (transient ischemic attack)     Past Surgical History:  Procedure Laterality Date   COLONOSCOPY     DILATION AND CURETTAGE OF UTERUS N/A 06/25/2022   Procedure: DILATATION AND CURETTAGE, VAGINAL BIOPSY;  Surgeon: Clide Cliff, MD;  Location: [facility];  Service: Gynecology;  Laterality: N/A;   INTRAUTERINE DEVICE (IUD) INSERTION N/A 06/25/2022   Procedure: INTRAUTERINE DEVICE (IUD) INSERTION;  Surgeon: Clide Cliff, MD;  Location: [facility];  Service: Gynecology;  Laterality: N/A;   NO PAST SURGERIES     ROBOTIC ASSISTED TOTAL HYSTERECTOMY WITH BILATERAL SALPINGO OOPHERECTOMY Bilateral 09/03/2022   Procedure: XI ROBOTIC ASSISTED TOTAL HYSTERECTOMY WITH BILATERAL SALPINGO OOPHORECTOMY > 250 grams, BILATERAL URETEROLYSIS, LAPAROTOMY;  Surgeon: Clide Cliff, MD;  Location: [facility];  Service: Gynecology;  Laterality: Bilateral;   SENTINEL NODE BIOPSY N/A 09/03/2022   Procedure: SENTINEL NODE BIOPSY;  Surgeon: Clide Cliff, MD;  Location: [facility];  Service: Gynecology;  Laterality: N/A;   TEE WITHOUT CARDIOVERSION  N/A 03/20/2017   Procedure: TRANSESOPHAGEAL ECHOCARDIOGRAM (TEE);   Surgeon: Yates Decamp, MD;  Location: [facility] ENDOSCOPY;  Service: Cardiovascular;  Laterality: N/A;    Family History  Problem Relation Age of Onset   Colon cancer Maternal Uncle        60s   Endometrial cancer Neg Hx    Breast cancer Neg Hx    Ovarian cancer Neg Hx     Social History   Socioeconomic History   Marital status: Single    Spouse name: Not on file   Number of children: Not on file   Years of education: Not on file   Highest education level: Not on file  Occupational History   Not on file  Tobacco Use   Smoking status: Never   Smokeless tobacco: Never  Vaping Use   Vaping Use: Never used  Substance and Sexual Activity   Alcohol use: No   Drug use: No   Sexual activity: Not Currently  Other Topics Concern   Not on file  Social History Narrative   Not on file   Social Determinants of Health   Financial Resource Strain: Not on file  Food Insecurity: No Food Insecurity (06/13/2022)   Hunger Vital Sign    Worried About Running Out of Food in the Last Year: Never true    Ran Out of Food in the Last Year: Never true  Transportation Needs: No Transportation Needs (06/13/2022)   PRAPARE - Administrator, Civil Service (Medical): No    Lack of Transportation (Non-Medical): No  Physical Activity: Not on file  Stress: Not on file  Social Connections: Not on file    Current Medications:  Current Outpatient Medications:    acetaminophen (TYLENOL) 500 MG tablet, Take 500 mg by mouth every 6 (six) hours as needed for moderate pain, fever or headache., Disp: , Rfl:    aspirin 325 MG tablet, Take 1 tablet (325 mg total) by mouth daily., Disp: 30 tablet, Rfl: 0   cetirizine (ZYRTEC) 10 MG tablet, Take 10 mg by mouth daily., Disp: , Rfl:    Cholecalciferol (VITAMIN D-3) 125 MCG (5000 UT) TABS, Take 5,000 Units by mouth daily., Disp: , Rfl:    folic acid (FOLVITE) 1 MG tablet, Take 1 mg by mouth daily., Disp: , Rfl:    glimepiride (AMARYL) 4 MG tablet, Take 4 mg  by mouth daily with breakfast., Disp: , Rfl:    ibuprofen (ADVIL) 200 MG tablet, Take 600 mg by mouth as needed for moderate pain., Disp: , Rfl:    Iron-FA-B Cmp-C-Biot-Probiotic (FUSION PLUS PO), Take 1 capsule by mouth daily., Disp: , Rfl:    JANUVIA 100 MG tablet, Take 100 mg by mouth daily., Disp: , Rfl:    levETIRAcetam (KEPPRA) 500 MG tablet, Take 1 tablet (500 mg total) by mouth 2 (two) times daily. Must be seen for further refills. Call [phone] to make an appointment. (Patient taking differently: Take 500 mg by mouth daily.), Disp: 60 tablet, Rfl: 0   megestrol (MEGACE) 40 MG tablet, Take 2 tablets (80 mg total) by mouth 2 (two) times daily., Disp: 120 tablet, Rfl: 0   montelukast (SINGULAIR) 10 MG tablet, Take 10 mg by mouth at bedtime., Disp: , Rfl:    Polyethyl Glycol-Propyl Glycol (SYSTANE OP), Place 1 drop into both eyes as needed (dry eye)., Disp: , Rfl:    rosuvastatin (CRESTOR) 20 MG tablet, Take 20 mg by mouth daily., Disp: , Rfl:    senna-docusate (SENOKOT-S)  8.6-50 MG tablet, Take 2 tablets by mouth at bedtime. For AFTER surgery, do not take if having diarrhea, Disp: 30 tablet, Rfl: 0   TOUJEO SOLOSTAR 300 UNIT/ML Solostar Pen, Inject 20 Units into the skin as needed (high BS)., Disp: , Rfl:    traMADol (ULTRAM) 50 MG tablet, Take 1 tablet (50 mg total) by mouth every 12 (twelve) hours as needed for severe pain. For AFTER surgery only, do not take and drive, Disp: 10 tablet, Rfl: 0   valsartan-hydrochlorothiazide (DIOVAN-HCT) 80-12.5 MG tablet, Take 1 tablet by mouth daily., Disp: , Rfl:   Review of Symptoms: Complete 10-system review is negative except as above in Interval History.  Physical Exam: BP 133/79 (BP Location: Left Arm, Patient Position: Sitting)   Pulse (!) 106   Temp 100 F (37.8 C) (Oral)   Resp 16   Ht 5' 2.99" (1.6 m)   Wt 215 lb (97.5 kg)   SpO2 100%   BMI 38.10 kg/m  General: Alert, oriented, no acute distress. HEENT: Normocephalic, atraumatic.  Neck symmetric without masses. Sclera anicteric.  Chest: Normal work of breathing. Clear to auscultation bilaterally.   Cardiovascular: Regular rate and rhythm, no murmurs. Abdomen: Soft, nontender.  Normoactive bowel sounds.  No masses appreciated.  Well-healing incisions, laparoscopic and pfannenstiel. No drainage or erythema. Extremities: Grossly normal range of motion.  Warm, well perfused.  No edema bilaterally. Skin: No rashes or lesions noted. GU: Normal appearing external genitalia without erythema, excoriation, or lesions.  Speculum exam reveals intact vaginal cuff, healing well, no discharge or drainage.  Bimanual exam reveals intact cuff. No fluctuance. Exam chaperoned by Andrey Cota, RN   Laboratory & Radiologic Studies: FINAL MICROSCOPIC DIAGNOSIS:  A. UTERUS, CERVIX, BILATERAL FALLOPIAN TUBES AND OVARIES: Endometrium     Endometrioid adenocarcinoma, FIGO grade 1 with squamous differentiation (pT1a)     Carcinoma invades superficial myometrium 2 mm into a 40 mm myometrium (5%)     Negative for lymphovascular involvement     All margins negative for carcinoma     See oncology table Cervix     Unremarkable, negative for malignancy Myometrium     Leiomyomata, negative for malignancy Bilateral ovaries     Unremarkable, negative for malignancy Bilateral fallopian tubes     Unremarkable, negative for malignancy  B. SENTINEL LYMPH NODE, LEFT EXTERNAL ILIAC, EXCISION: One lymph node negative for metastatic carcinoma (0/1)  C. SENTINEL LYMPH NODE, RIGHT COMMON ILIAC, EXCISION: One lymph node negative for metastatic carcinoma (0/1)  ONCOLOGY TABLE: UTERUS, CARCINOMA OR CARCINOSARCOMA: Resection Procedure: Total hysterectomy, bilateral salpingo-oophorectomy and left and right sentinel lymph nodes Histologic Type: Endometrioid with squamous differentiation Histologic Grade: FIGO grade 1 Myometrial Invasion:      Depth of Myometrial Invasion (mm): 2 mm      Myometrial  Thickness (mm): 40 mm      Percentage of Myometrial Invasion: 5 Uterine Serosa Involvement: Not identified Cervical stromal Involvement: Not identified Extent of involvement of other tissue/organs: Not identified Peritoneal/Ascitic Fluid: Not applicable Lymphovascular Invasion: Not identified Regional Lymph Nodes:      Pelvic Lymph Nodes Examined:          2 Sentinel          0 non-sentinel          2 total      Pelvic Lymph Nodes with Metastasis: 0          Macrometastasis: (>2.0 mm): 0          Micrometastasis: (>0.2 mm  and < 2.0 mm): 0          Isolated Tumor Cells (<0.2 mm): 0          Laterality of Lymph Node with Tumor: Not applicable          Extracapsular Extension: Not applicable Distant Metastasis:      Distant Site(s) Involved: Not applicable Pathologic Stage Classification (pTNM, AJCC 8th Edition): pT1a, pN0 Ancillary Studies: MMR, MSI and p53 are pending Representative Tumor Block: A4, A8, A9 Comment(s): Cytokeratin AE1/AE3 performed on the sentinel lymph nodes (parts B and C) is negative. (v4.2.0.1)   IHC EXPRESSION RESULTS   TEST           RESULT  MLH1:          Preserved nuclear expression  MSH2:          Preserved nuclear expression  MSH6:          Preserved nuclear expression  PMS2:          Preserved nuclear expression   Immunohistochemical stain for p53 shows a normal, wild-type  expression pattern

## 2022-09-30 NOTE — Patient Instructions (Signed)
It was a pleasure to see you in clinic today. - Overall healing well. - We will get labs today - If you notice fevers, chills, nausea, vomiting, etc. At home, let Kelly Davies know - Return visit planned for 6months  Thank you very much for allowing me to provide care for you today.  I appreciate your confidence in choosing our Gynecologic Oncology team at [facility].  If you have any questions about your visit today please call our office or send Kelly Davies a MyChart message and we will get back to you as soon as possible.

## 2022-10-04 ENCOUNTER — Encounter: Payer: Self-pay | Admitting: Gynecologic Oncology

## 2022-10-06 ENCOUNTER — Encounter: Payer: Self-pay | Admitting: Psychiatry

## 2022-10-21 ENCOUNTER — Encounter: Payer: Self-pay | Admitting: Psychiatry

## 2022-10-23 ENCOUNTER — Telehealth: Payer: Self-pay | Admitting: *Deleted

## 2022-10-23 NOTE — Telephone Encounter (Signed)
FMLA paperwork fax

## 2023-02-14 ENCOUNTER — Other Ambulatory Visit (HOSPITAL_COMMUNITY): Payer: Self-pay | Admitting: Internal Medicine

## 2023-02-14 DIAGNOSIS — E213 Hyperparathyroidism, unspecified: Secondary | ICD-10-CM

## 2023-03-26 ENCOUNTER — Telehealth: Payer: Self-pay

## 2023-03-26 NOTE — Telephone Encounter (Signed)
Pt called stating she needs to reschedule her upcoming appointment on 12/16 with Dr.Newton d/t schedule conflict.   Pt is now scheduled for 1/6 @ 2:30. Pt agreed to date/time

## 2023-03-31 ENCOUNTER — Inpatient Hospital Stay (HOSPITAL_BASED_OUTPATIENT_CLINIC_OR_DEPARTMENT_OTHER): Payer: 59 | Admitting: Psychiatry

## 2023-03-31 DIAGNOSIS — C541 Malignant neoplasm of endometrium: Secondary | ICD-10-CM

## 2023-04-01 ENCOUNTER — Encounter: Payer: Self-pay | Admitting: Psychiatry

## 2023-04-01 NOTE — Progress Notes (Signed)
This encounter was created in error - please disregard.

## 2023-04-15 ENCOUNTER — Other Ambulatory Visit (HOSPITAL_COMMUNITY): Payer: Self-pay | Admitting: Internal Medicine

## 2023-04-15 DIAGNOSIS — E213 Hyperparathyroidism, unspecified: Secondary | ICD-10-CM

## 2023-04-21 ENCOUNTER — Inpatient Hospital Stay: Payer: 59 | Attending: Psychiatry | Admitting: Psychiatry

## 2023-04-21 ENCOUNTER — Encounter: Payer: Self-pay | Admitting: Psychiatry

## 2023-04-21 VITALS — BP 139/78 | HR 98 | Temp 97.1°F | Resp 16 | Wt 230.6 lb

## 2023-04-21 DIAGNOSIS — Z9079 Acquired absence of other genital organ(s): Secondary | ICD-10-CM | POA: Insufficient documentation

## 2023-04-21 DIAGNOSIS — Z8542 Personal history of malignant neoplasm of other parts of uterus: Secondary | ICD-10-CM | POA: Diagnosis not present

## 2023-04-21 DIAGNOSIS — Z90722 Acquired absence of ovaries, bilateral: Secondary | ICD-10-CM | POA: Diagnosis not present

## 2023-04-21 DIAGNOSIS — C541 Malignant neoplasm of endometrium: Secondary | ICD-10-CM

## 2023-04-21 DIAGNOSIS — Z90710 Acquired absence of both cervix and uterus: Secondary | ICD-10-CM | POA: Diagnosis not present

## 2023-04-21 NOTE — Progress Notes (Signed)
 Gynecologic Oncology Return Clinic Visit  Date of Service: 04/21/2023 Referring Provider: Robbi Render, MD   Assessment & Plan: Kelly Davies is a 53 y.o. woman with Stage  IA2 FIGO grade 1 endometrioid endometrial cancer (no LVSI, p53wt, MMRp) s/p RA-TLH, BSO, SLNB, ureterolysis on 09/03/22, who presents for surveillance.   Endometrial cancer: - No high-intermediate risk factors. - NED one exam today - Signs/symptoms of recurrence reviewed. - Surveillance reviewed. Follow-up q6 months x2 years then yearly.  - Reviewed that after 5 years NED, will be safe to return to Ob/Gyn.   RTC 19mo.  Kelly Masters, MD Gynecologic Oncology   Medical Decision Making I personally spent  TOTAL 15 minutes face-to-face and non-face-to-face in the care of this patient, which includes all pre, intra, and post visit time on the date of service.    ----------------------- Reason for Visit:Surveillance  Treatment History: Oncology History  Endometrial cancer (HCC)  04/14/2022 Imaging   Pelvic ultrasound: IMPRESSION: Heterogeneous uterine fibroid with subsequently limited evaluation of the endometrium.   05/28/2022 Initial Biopsy   Endometrial biopsy: FIGO grade 1 endometrioid adenocarcinoma    06/25/2022 Initial Diagnosis   Endometrial cancer (HCC)   06/25/2022 Surgery   Dilation and curettage, Mirena  intrauterine device insertion, vaginal biopsy     Pathology Results   A. ENDOMETRIUM, CURETTAGE:  - Endometrioid carcinoma, FIGO grade 1.  See comment   B. VAGINA, POSTERIOR, BIOPSY:  - Vaginal mucosa with no specific histopathologic changes  - Negative for dysplasia    09/03/2022 Surgery   RA-TLH, BSO, SLNB, ureterolysis, laparotomy for specimen retrieval   09/03/2022 Pathology Results   FINAL MICROSCOPIC DIAGNOSIS:  A. UTERUS, CERVIX, BILATERAL FALLOPIAN TUBES AND OVARIES: Endometrium     Endometrioid adenocarcinoma, FIGO grade 1 with squamous differentiation (pT1a)      Carcinoma invades superficial myometrium 2 mm into a 40 mm myometrium (5%)     Negative for lymphovascular involvement     All margins negative for carcinoma     See oncology table Cervix     Unremarkable, negative for malignancy Myometrium     Leiomyomata, negative for malignancy Bilateral ovaries     Unremarkable, negative for malignancy Bilateral fallopian tubes     Unremarkable, negative for malignancy  B. SENTINEL LYMPH NODE, LEFT EXTERNAL ILIAC, EXCISION: One lymph node negative for metastatic carcinoma (0/1)  C. SENTINEL LYMPH NODE, RIGHT COMMON ILIAC, EXCISION: One lymph node negative for metastatic carcinoma (0/1)  ONCOLOGY TABLE: UTERUS, CARCINOMA OR CARCINOSARCOMA: Resection Procedure: Total hysterectomy, bilateral salpingo-oophorectomy and left and right sentinel lymph nodes Histologic Type: Endometrioid with squamous differentiation Histologic Grade: FIGO grade 1 Myometrial Invasion:      Depth of Myometrial Invasion (mm): 2 mm      Myometrial Thickness (mm): 40 mm      Percentage of Myometrial Invasion: 5 Uterine Serosa Involvement: Not identified Cervical stromal Involvement: Not identified Extent of involvement of other tissue/organs: Not identified Peritoneal/Ascitic Fluid: Not applicable Lymphovascular Invasion: Not identified Regional Lymph Nodes:      Pelvic Lymph Nodes Examined:          2 Sentinel          0 non-sentinel          2 total      Pelvic Lymph Nodes with Metastasis: 0          Macrometastasis: (>2.0 mm): 0          Micrometastasis: (>0.2 mm and < 2.0 mm): 0  Isolated Tumor Cells (<0.2 mm): 0          Laterality of Lymph Node with Tumor: Not applicable          Extracapsular Extension: Not applicable Distant Metastasis:      Distant Site(s) Involved: Not applicable Pathologic Stage Classification (pTNM, AJCC 8th Edition): pT1a, pN0 Ancillary Studies: MMR, MSI and p53 are pending Representative Tumor Block: A4, A8,  A9 Comment(s): Cytokeratin AE1/AE3 performed on the sentinel lymph nodes (parts B and C) is negative. (v4.2.0.1)   IHC EXPRESSION RESULTS   TEST           RESULT  MLH1:          Preserved nuclear expression  MSH2:          Preserved nuclear expression  MSH6:          Preserved nuclear expression  PMS2:          Preserved nuclear expression   Immunohistochemical stain for p53 shows a normal, wild-type  expression pattern      Interval History: Pt reports that she is doing well today. No new vaginal bleeding, abdominal/pelvic pain, unintentional weight loss, change in bowel or bladder habits, early satiety, bloating, nausea/vomiting.    Past Medical/Surgical History: Past Medical History:  Diagnosis Date   Anemia    Cancer (HCC)    Diabetes mellitus without complication (HCC)    Hypercholesteremia    Hypertension    Partial seizure (HCC)    Stroke (HCC)    TIA (transient ischemic attack)     Past Surgical History:  Procedure Laterality Date   COLONOSCOPY     DILATION AND CURETTAGE OF UTERUS N/A 06/25/2022   Procedure: DILATATION AND CURETTAGE, VAGINAL BIOPSY;  Surgeon: Eldonna Mays, MD;  Location: [facility];  Service: Gynecology;  Laterality: N/A;   INTRAUTERINE DEVICE (IUD) INSERTION N/A 06/25/2022   Procedure: INTRAUTERINE DEVICE (IUD) INSERTION;  Surgeon: Eldonna Mays, MD;  Location: [facility];  Service: Gynecology;  Laterality: N/A;   NO PAST SURGERIES     ROBOTIC ASSISTED TOTAL HYSTERECTOMY WITH BILATERAL SALPINGO OOPHERECTOMY Bilateral 09/03/2022   Procedure: XI ROBOTIC ASSISTED TOTAL HYSTERECTOMY WITH BILATERAL SALPINGO OOPHORECTOMY > 250 grams, BILATERAL URETEROLYSIS, LAPAROTOMY;  Surgeon: Eldonna Mays, MD;  Location: [facility];  Service: Gynecology;  Laterality: Bilateral;   SENTINEL NODE BIOPSY N/A 09/03/2022   Procedure: SENTINEL NODE BIOPSY;  Surgeon: Eldonna Mays, MD;  Location: [facility];  Service: Gynecology;  Laterality: N/A;   TEE WITHOUT CARDIOVERSION  N/A 03/20/2017   Procedure: TRANSESOPHAGEAL ECHOCARDIOGRAM (TEE);  Surgeon: Ladona Heinz, MD;  Location: [facility] ENDOSCOPY;  Service: Cardiovascular;  Laterality: N/A;    Family History  Problem Relation Age of Onset   Colon cancer Maternal Uncle        60s   Endometrial cancer Neg Hx    Breast cancer Neg Hx    Ovarian cancer Neg Hx     Social History   Socioeconomic History   Marital status: Single    Spouse name: Not on file   Number of children: Not on file   Years of education: Not on file   Highest education level: Not on file  Occupational History   Not on file  Tobacco Use   Smoking status: Never   Smokeless tobacco: Never  Vaping Use   Vaping status: Never Used  Substance and Sexual Activity   Alcohol use: No   Drug use: No   Sexual activity: Not Currently  Other Topics Concern  Not on file  Social History Narrative   Not on file   Social Drivers of Health   Financial Resource Strain: Not on file  Food Insecurity: No Food Insecurity (06/13/2022)   Hunger Vital Sign    Worried About Running Out of Food in the Last Year: Never true    Ran Out of Food in the Last Year: Never true  Transportation Needs: No Transportation Needs (06/13/2022)   PRAPARE - Administrator, Civil Service (Medical): No    Lack of Transportation (Non-Medical): No  Physical Activity: Not on file  Stress: Not on file  Social Connections: Not on file    Current Medications:  Current Outpatient Medications:    acetaminophen  (TYLENOL ) 500 MG tablet, Take 500 mg by mouth every 6 (six) hours as needed for moderate pain, fever or headache., Disp: , Rfl:    aspirin  325 MG tablet, Take 1 tablet (325 mg total) by mouth daily., Disp: 30 tablet, Rfl: 0   cetirizine (ZYRTEC) 10 MG tablet, Take 10 mg by mouth daily., Disp: , Rfl:    Cholecalciferol (VITAMIN D-3) 125 MCG (5000 UT) TABS, Take 5,000 Units by mouth daily., Disp: , Rfl:    folic acid (FOLVITE) 1 MG tablet, Take 1 mg by mouth  daily., Disp: , Rfl:    glimepiride (AMARYL) 4 MG tablet, Take 4 mg by mouth daily with breakfast., Disp: , Rfl:    ibuprofen (ADVIL) 200 MG tablet, Take 600 mg by mouth as needed for moderate pain., Disp: , Rfl:    Iron-FA-B Cmp-C-Biot-Probiotic (FUSION PLUS PO), Take 1 capsule by mouth daily., Disp: , Rfl:    JANUVIA 100 MG tablet, Take 100 mg by mouth daily., Disp: , Rfl:    levETIRAcetam  (KEPPRA ) 500 MG tablet, Take 1 tablet (500 mg total) by mouth 2 (two) times daily. Must be seen for further refills. Call [phone] to make an appointment. (Patient taking differently: Take 500 mg by mouth daily.), Disp: 60 tablet, Rfl: 0   megestrol  (MEGACE ) 40 MG tablet, Take 2 tablets (80 mg total) by mouth 2 (two) times daily., Disp: 120 tablet, Rfl: 0   montelukast (SINGULAIR) 10 MG tablet, Take 10 mg by mouth at bedtime., Disp: , Rfl:    Polyethyl Glycol-Propyl Glycol (SYSTANE OP), Place 1 drop into both eyes as needed (dry eye)., Disp: , Rfl:    rosuvastatin (CRESTOR) 20 MG tablet, Take 20 mg by mouth daily., Disp: , Rfl:    senna-docusate (SENOKOT-S) 8.6-50 MG tablet, Take 2 tablets by mouth at bedtime. For AFTER surgery, do not take if having diarrhea, Disp: 30 tablet, Rfl: 0   TOUJEO  SOLOSTAR 300 UNIT/ML Solostar Pen, Inject 20 Units into the skin as needed (high BS)., Disp: , Rfl:    traMADol  (ULTRAM ) 50 MG tablet, Take 1 tablet (50 mg total) by mouth every 12 (twelve) hours as needed for severe pain. For AFTER surgery only, do not take and drive, Disp: 10 tablet, Rfl: 0   valsartan-hydrochlorothiazide (DIOVAN-HCT) 80-12.5 MG tablet, Take 1 tablet by mouth daily., Disp: , Rfl:   Review of Symptoms: Complete 10-system review is negative except as above in Interval History.  Physical Exam: BP 139/78 (BP Location: Right Arm, Patient Position: Sitting)   Pulse 98   Temp (!) 97.1 F (36.2 C) (Tympanic)   Resp 16   Wt 230 lb 9.6 oz (104.6 kg)   SpO2 99%   BMI 40.86 kg/m  General: Alert,  oriented, no acute distress. HEENT: Normocephalic,  atraumatic. Neck symmetric. Sclera anicteric.  Chest: Normal work of breathing. Clear to auscultation bilaterally.   Cardiovascular: Regular rate and rhythm, no murmurs. Abdomen: Soft, nontender.  Normoactive bowel sounds.  No masses appreciated.  Well-healed incisions. Extremities: Grossly normal range of motion.  Warm, well perfused.  No edema bilaterally. Skin: No rashes or lesions noted. Lymphatics: No cervical, supraclavicular, or inguinal adenopathy. GU: Normal appearing external genitalia without erythema, excoriation, or lesions.  Speculum exam reveals atrophic, otherwise normal vaginal mucosa and cuff without lesion.  Bimanual exam reveals smooth vaginal cuff. No pelvic mass or nodularity.  Exam chaperoned by Eleanor Epps, NP     Laboratory & Radiologic Studies: None

## 2023-04-21 NOTE — Patient Instructions (Signed)
 It was a pleasure to see you in clinic today. - Normal exam today - Return visit planned for 37mo  Thank you very much for allowing me to provide care for you today.  I appreciate your confidence in choosing our Gynecologic Oncology team at [facility].  If you have any questions about your visit today please call our office or send us  a MyChart message and we will get back to you as soon as possible.

## 2023-05-02 ENCOUNTER — Encounter (HOSPITAL_COMMUNITY): Admission: RE | Admit: 2023-05-02 | Payer: 59 | Source: Ambulatory Visit

## 2023-05-02 ENCOUNTER — Encounter (HOSPITAL_COMMUNITY): Payer: 59

## 2023-05-26 ENCOUNTER — Encounter (HOSPITAL_COMMUNITY): Payer: Self-pay

## 2023-05-26 ENCOUNTER — Encounter (HOSPITAL_COMMUNITY): Payer: 59

## 2023-07-02 ENCOUNTER — Other Ambulatory Visit: Payer: Self-pay | Admitting: Family Medicine

## 2023-07-02 DIAGNOSIS — Z1231 Encounter for screening mammogram for malignant neoplasm of breast: Secondary | ICD-10-CM

## 2023-07-25 ENCOUNTER — Ambulatory Visit
Admission: RE | Admit: 2023-07-25 | Discharge: 2023-07-25 | Disposition: A | Discharge status: Home / Self Care | Source: Ambulatory Visit | Attending: Family Medicine | Admitting: Family Medicine

## 2023-07-25 DIAGNOSIS — Z1231 Encounter for screening mammogram for malignant neoplasm of breast: Secondary | ICD-10-CM

## 2023-10-20 ENCOUNTER — Inpatient Hospital Stay: Payer: 59 | Attending: Psychiatry | Admitting: Psychiatry

## 2023-10-20 ENCOUNTER — Encounter: Payer: Self-pay | Admitting: Psychiatry

## 2023-10-20 VITALS — BP 156/78 | HR 96 | Temp 99.0°F | Resp 20 | Wt 235.2 lb

## 2023-10-20 DIAGNOSIS — Z08 Encounter for follow-up examination after completed treatment for malignant neoplasm: Secondary | ICD-10-CM | POA: Diagnosis not present

## 2023-10-20 DIAGNOSIS — Z90710 Acquired absence of both cervix and uterus: Secondary | ICD-10-CM | POA: Diagnosis not present

## 2023-10-20 DIAGNOSIS — Z90722 Acquired absence of ovaries, bilateral: Secondary | ICD-10-CM | POA: Insufficient documentation

## 2023-10-20 DIAGNOSIS — C541 Malignant neoplasm of endometrium: Secondary | ICD-10-CM

## 2023-10-20 DIAGNOSIS — Z9079 Acquired absence of other genital organ(s): Secondary | ICD-10-CM | POA: Insufficient documentation

## 2023-10-20 DIAGNOSIS — Z8542 Personal history of malignant neoplasm of other parts of uterus: Secondary | ICD-10-CM | POA: Diagnosis present

## 2023-10-20 NOTE — Progress Notes (Signed)
 Gynecologic Oncology Return Clinic Visit  Date of Service: 10/20/2023 Referring Provider: Robbi Render, MD   Assessment & Plan: Kelly Davies is a 53 y.o. woman with Stage  IA2 FIGO grade 1 endometrioid endometrial cancer (no LVSI, p53wt, MMRp) s/p RA-TLH, BSO, SLNB, ureterolysis on 09/03/22, who presents for surveillance.   Endometrial cancer: - No high-intermediate risk factors. - NED on exam today - Signs/symptoms of recurrence reviewed. - Surveillance reviewed. Follow-up q6 months x2 years then yearly.  - Reviewed that after 5 years NED, will be safe to return to Ob/Gyn.   RTC 7mo.  Hoy Masters, MD Gynecologic Oncology   Medical Decision Making I personally spent  TOTAL 15 minutes face-to-face and non-face-to-face in the care of this patient, which includes all pre, intra, and post visit time on the date of service.    ----------------------- Reason for Visit:Surveillance  Treatment History: Oncology History  Endometrial cancer (HCC)  04/14/2022 Imaging   Pelvic ultrasound: IMPRESSION: Heterogeneous uterine fibroid with subsequently limited evaluation of the endometrium.   05/28/2022 Initial Biopsy   Endometrial biopsy: FIGO grade 1 endometrioid adenocarcinoma    06/25/2022 Initial Diagnosis   Endometrial cancer (HCC)   06/25/2022 Surgery   Dilation and curettage, Mirena  intrauterine device insertion, vaginal biopsy     Pathology Results   A. ENDOMETRIUM, CURETTAGE:  - Endometrioid carcinoma, FIGO grade 1.  See comment   B. VAGINA, POSTERIOR, BIOPSY:  - Vaginal mucosa with no specific histopathologic changes  - Negative for dysplasia    09/03/2022 Surgery   RA-TLH, BSO, SLNB, ureterolysis, laparotomy for specimen retrieval   09/03/2022 Pathology Results   FINAL MICROSCOPIC DIAGNOSIS:  A. UTERUS, CERVIX, BILATERAL FALLOPIAN TUBES AND OVARIES: Endometrium     Endometrioid adenocarcinoma, FIGO grade 1 with squamous differentiation (pT1a)      Carcinoma invades superficial myometrium 2 mm into a 40 mm myometrium (5%)     Negative for lymphovascular involvement     All margins negative for carcinoma     See oncology table Cervix     Unremarkable, negative for malignancy Myometrium     Leiomyomata, negative for malignancy Bilateral ovaries     Unremarkable, negative for malignancy Bilateral fallopian tubes     Unremarkable, negative for malignancy  B. SENTINEL LYMPH NODE, LEFT EXTERNAL ILIAC, EXCISION: One lymph node negative for metastatic carcinoma (0/1)  C. SENTINEL LYMPH NODE, RIGHT COMMON ILIAC, EXCISION: One lymph node negative for metastatic carcinoma (0/1)  ONCOLOGY TABLE: UTERUS, CARCINOMA OR CARCINOSARCOMA: Resection Procedure: Total hysterectomy, bilateral salpingo-oophorectomy and left and right sentinel lymph nodes Histologic Type: Endometrioid with squamous differentiation Histologic Grade: FIGO grade 1 Myometrial Invasion:      Depth of Myometrial Invasion (mm): 2 mm      Myometrial Thickness (mm): 40 mm      Percentage of Myometrial Invasion: 5 Uterine Serosa Involvement: Not identified Cervical stromal Involvement: Not identified Extent of involvement of other tissue/organs: Not identified Peritoneal/Ascitic Fluid: Not applicable Lymphovascular Invasion: Not identified Regional Lymph Nodes:      Pelvic Lymph Nodes Examined:          2 Sentinel          0 non-sentinel          2 total      Pelvic Lymph Nodes with Metastasis: 0          Macrometastasis: (>2.0 mm): 0          Micrometastasis: (>0.2 mm and < 2.0 mm): 0  Isolated Tumor Cells (<0.2 mm): 0          Laterality of Lymph Node with Tumor: Not applicable          Extracapsular Extension: Not applicable Distant Metastasis:      Distant Site(s) Involved: Not applicable Pathologic Stage Classification (pTNM, AJCC 8th Edition): pT1a, pN0 Ancillary Studies: MMR, MSI and p53 are pending Representative Tumor Block: A4, A8,  A9 Comment(s): Cytokeratin AE1/AE3 performed on the sentinel lymph nodes (parts B and C) is negative. (v4.2.0.1)   IHC EXPRESSION RESULTS   TEST           RESULT  MLH1:          Preserved nuclear expression  MSH2:          Preserved nuclear expression  MSH6:          Preserved nuclear expression  PMS2:          Preserved nuclear expression   Immunohistochemical stain for p53 shows a normal, wild-type  expression pattern      Interval History: Pt reports that she is doing well today. No new vaginal bleeding, abdominal/pelvic pain, unintentional weight loss, change in bowel or bladder habits, early satiety, bloating, nausea/vomiting.    Past Medical/Surgical History: Past Medical History:  Diagnosis Date   Anemia    Cancer (HCC)    Diabetes mellitus without complication (HCC)    Hypercholesteremia    Hypertension    Partial seizure (HCC)    Stroke (HCC)    TIA (transient ischemic attack)     Past Surgical History:  Procedure Laterality Date   BREAST BIOPSY Left    COLONOSCOPY     DILATION AND CURETTAGE OF UTERUS N/A 06/25/2022   Procedure: DILATATION AND CURETTAGE, VAGINAL BIOPSY;  Surgeon: Eldonna Mays, MD;  Location: [facility];  Service: Gynecology;  Laterality: N/A;   INTRAUTERINE DEVICE (IUD) INSERTION N/A 06/25/2022   Procedure: INTRAUTERINE DEVICE (IUD) INSERTION;  Surgeon: Eldonna Mays, MD;  Location: [facility];  Service: Gynecology;  Laterality: N/A;   NO PAST SURGERIES     ROBOTIC ASSISTED TOTAL HYSTERECTOMY WITH BILATERAL SALPINGO OOPHERECTOMY Bilateral 09/03/2022   Procedure: XI ROBOTIC ASSISTED TOTAL HYSTERECTOMY WITH BILATERAL SALPINGO OOPHORECTOMY > 250 grams, BILATERAL URETEROLYSIS, LAPAROTOMY;  Surgeon: Eldonna Mays, MD;  Location: [facility];  Service: Gynecology;  Laterality: Bilateral;   SENTINEL NODE BIOPSY N/A 09/03/2022   Procedure: SENTINEL NODE BIOPSY;  Surgeon: Eldonna Mays, MD;  Location: [facility];  Service: Gynecology;  Laterality: N/A;    TEE WITHOUT CARDIOVERSION N/A 03/20/2017   Procedure: TRANSESOPHAGEAL ECHOCARDIOGRAM (TEE);  Surgeon: Ladona Heinz, MD;  Location: [facility];  Service: Cardiovascular;  Laterality: N/A;    Family History  Problem Relation Age of Onset   Colon cancer Maternal Uncle        60s   Endometrial cancer Neg Hx    Breast cancer Neg Hx    Ovarian cancer Neg Hx     Social History   Socioeconomic History   Marital status: Single    Spouse name: Not on file   Number of children: Not on file   Years of education: Not on file   Highest education level: Not on file  Occupational History   Not on file  Tobacco Use   Smoking status: Never   Smokeless tobacco: Never  Vaping Use   Vaping status: Never Used  Substance and Sexual Activity   Alcohol use: No   Drug use: No   Sexual activity: Not Currently  Other Topics Concern   Not on file  Social History Narrative   Not on file   Social Drivers of Health   Financial Resource Strain: Not on file  Food Insecurity: No Food Insecurity (06/13/2022)   Hunger Vital Sign    Worried About Running Out of Food in the Last Year: Never true    Ran Out of Food in the Last Year: Never true  Transportation Needs: No Transportation Needs (06/13/2022)   PRAPARE - Administrator, Civil Service (Medical): No    Lack of Transportation (Non-Medical): No  Physical Activity: Not on file  Stress: Not on file  Social Connections: Not on file    Current Medications:  Current Outpatient Medications:    insulin  glargine (LANTUS ) 100 UNIT/ML injection, Inject 70 Units into the skin daily. Lantus  70/30, Disp: , Rfl:    valsartan (DIOVAN) 160 MG tablet, Take 160 mg by mouth daily., Disp: , Rfl:    acetaminophen  (TYLENOL ) 500 MG tablet, Take 500 mg by mouth every 6 (six) hours as needed for moderate pain, fever or headache., Disp: , Rfl:    aspirin  325 MG tablet, Take 1 tablet (325 mg total) by mouth daily., Disp: 30 tablet, Rfl: 0   cetirizine  (ZYRTEC) 10 MG tablet, Take 10 mg by mouth daily., Disp: , Rfl:    Cholecalciferol (VITAMIN D-3) 125 MCG (5000 UT) TABS, Take 5,000 Units by mouth daily., Disp: , Rfl:    folic acid (FOLVITE) 1 MG tablet, Take 1 mg by mouth daily., Disp: , Rfl:    glimepiride (AMARYL) 4 MG tablet, Take 4 mg by mouth daily with breakfast., Disp: , Rfl:    ibuprofen (ADVIL) 200 MG tablet, Take 600 mg by mouth as needed for moderate pain., Disp: , Rfl:    Iron-FA-B Cmp-C-Biot-Probiotic (FUSION PLUS PO), Take 1 capsule by mouth daily., Disp: , Rfl:    JANUVIA 100 MG tablet, Take 100 mg by mouth daily., Disp: , Rfl:    levETIRAcetam  (KEPPRA ) 500 MG tablet, Take 1 tablet (500 mg total) by mouth 2 (two) times daily. Must be seen for further refills. Call [phone] to make an appointment. (Patient taking differently: Take 500 mg by mouth daily.), Disp: 60 tablet, Rfl: 0   megestrol  (MEGACE ) 40 MG tablet, Take 2 tablets (80 mg total) by mouth 2 (two) times daily., Disp: 120 tablet, Rfl: 0   montelukast (SINGULAIR) 10 MG tablet, Take 10 mg by mouth at bedtime., Disp: , Rfl:    Polyethyl Glycol-Propyl Glycol (SYSTANE OP), Place 1 drop into both eyes as needed (dry eye)., Disp: , Rfl:    rosuvastatin (CRESTOR) 20 MG tablet, Take 20 mg by mouth daily., Disp: , Rfl:    senna-docusate (SENOKOT-S) 8.6-50 MG tablet, Take 2 tablets by mouth at bedtime. For AFTER surgery, do not take if having diarrhea, Disp: 30 tablet, Rfl: 0   TOUJEO  SOLOSTAR 300 UNIT/ML Solostar Pen, Inject 20 Units into the skin as needed (high BS)., Disp: , Rfl:    traMADol  (ULTRAM ) 50 MG tablet, Take 1 tablet (50 mg total) by mouth every 12 (twelve) hours as needed for severe pain. For AFTER surgery only, do not take and drive, Disp: 10 tablet, Rfl: 0   valsartan-hydrochlorothiazide (DIOVAN-HCT) 80-12.5 MG tablet, Take 1 tablet by mouth daily., Disp: , Rfl:   Review of Symptoms: Complete 10-system review is negative except as above in Interval  History.  Physical Exam: BP (!) 154/73 (BP Location: Left Arm, Patient Position: Sitting)  Comment: Notified RN  Pulse 96   Temp 99 F (37.2 C) (Oral)   Resp 20   Wt 235 lb 3.2 oz (106.7 kg)   SpO2 100%   BMI 41.67 kg/m  General: Alert, oriented, no acute distress. HEENT: Normocephalic, atraumatic. Neck symmetric. Sclera anicteric.  Chest: Normal work of breathing. Clear to auscultation bilaterally.   Cardiovascular: Regular rate and rhythm, no murmurs. Abdomen: Soft, nontender.  Normoactive bowel sounds.  No masses appreciated.  Well-healed incisions. Extremities: Grossly normal range of motion.  Warm, well perfused.  No edema bilaterally. Skin: No rashes or lesions noted. Lymphatics: No cervical, supraclavicular, or inguinal adenopathy. GU: Normal appearing external genitalia without erythema, excoriation, or lesions.  Speculum exam reveals normal vaginal mucosa and cuff without lesion.  Bimanual exam reveals smooth vaginal cuff. No pelvic mass or nodularity.  Exam chaperoned by Remie [place], CMA    Laboratory & Radiologic Studies: None
